# Patient Record
Sex: Male | Born: 1965 | Race: White | Hispanic: No | Marital: Single | Smoking: Current every day smoker
Health system: Southern US, Community
[De-identification: ages and names within clinical notes are randomized; demographics above are authoritative.]

## PROBLEM LIST (undated history)

## (undated) DIAGNOSIS — F1910 Other psychoactive substance abuse, uncomplicated: Secondary | ICD-10-CM

## (undated) DIAGNOSIS — Z720 Tobacco use: Secondary | ICD-10-CM

---

## 1999-10-19 ENCOUNTER — Emergency Department (HOSPITAL_COMMUNITY): Admission: EM | Admit: 1999-10-19 | Discharge: 1999-10-19 | Payer: Self-pay | Admitting: Emergency Medicine

## 2004-01-02 ENCOUNTER — Inpatient Hospital Stay (HOSPITAL_COMMUNITY): Admission: AD | Admit: 2004-01-02 | Discharge: 2004-01-11 | Payer: Self-pay | Admitting: Psychiatry

## 2020-10-23 ENCOUNTER — Inpatient Hospital Stay (HOSPITAL_COMMUNITY): Payer: BC Managed Care – PPO

## 2020-10-23 ENCOUNTER — Inpatient Hospital Stay (HOSPITAL_COMMUNITY)
Admission: EM | Admit: 2020-10-23 | Discharge: 2020-10-26 | DRG: 917 | Disposition: A | Discharge status: Home / Self Care | Payer: BC Managed Care – PPO | Attending: Student | Admitting: Student

## 2020-10-23 ENCOUNTER — Encounter (HOSPITAL_COMMUNITY): Payer: Self-pay

## 2020-10-23 ENCOUNTER — Emergency Department (HOSPITAL_COMMUNITY): Payer: BC Managed Care – PPO

## 2020-10-23 ENCOUNTER — Other Ambulatory Visit: Payer: Self-pay

## 2020-10-23 DIAGNOSIS — F1490 Cocaine use, unspecified, uncomplicated: Secondary | ICD-10-CM | POA: Diagnosis not present

## 2020-10-23 DIAGNOSIS — T68XXXA Hypothermia, initial encounter: Secondary | ICD-10-CM

## 2020-10-23 DIAGNOSIS — R7989 Other specified abnormal findings of blood chemistry: Secondary | ICD-10-CM | POA: Diagnosis present

## 2020-10-23 DIAGNOSIS — G928 Other toxic encephalopathy: Secondary | ICD-10-CM | POA: Diagnosis present

## 2020-10-23 DIAGNOSIS — G9341 Metabolic encephalopathy: Secondary | ICD-10-CM | POA: Diagnosis not present

## 2020-10-23 DIAGNOSIS — Z20822 Contact with and (suspected) exposure to covid-19: Secondary | ICD-10-CM | POA: Diagnosis present

## 2020-10-23 DIAGNOSIS — N179 Acute kidney failure, unspecified: Secondary | ICD-10-CM | POA: Diagnosis present

## 2020-10-23 DIAGNOSIS — I214 Non-ST elevation (NSTEMI) myocardial infarction: Secondary | ICD-10-CM | POA: Diagnosis present

## 2020-10-23 DIAGNOSIS — Z8249 Family history of ischemic heart disease and other diseases of the circulatory system: Secondary | ICD-10-CM | POA: Diagnosis not present

## 2020-10-23 DIAGNOSIS — F17200 Nicotine dependence, unspecified, uncomplicated: Secondary | ICD-10-CM | POA: Diagnosis present

## 2020-10-23 DIAGNOSIS — Y900 Blood alcohol level of less than 20 mg/100 ml: Secondary | ICD-10-CM | POA: Diagnosis present

## 2020-10-23 DIAGNOSIS — D6959 Other secondary thrombocytopenia: Secondary | ICD-10-CM | POA: Diagnosis present

## 2020-10-23 DIAGNOSIS — I9589 Other hypotension: Secondary | ICD-10-CM | POA: Diagnosis not present

## 2020-10-23 DIAGNOSIS — R570 Cardiogenic shock: Secondary | ICD-10-CM | POA: Diagnosis present

## 2020-10-23 DIAGNOSIS — R339 Retention of urine, unspecified: Secondary | ICD-10-CM | POA: Diagnosis present

## 2020-10-23 DIAGNOSIS — G929 Unspecified toxic encephalopathy: Secondary | ICD-10-CM | POA: Diagnosis not present

## 2020-10-23 DIAGNOSIS — F10129 Alcohol abuse with intoxication, unspecified: Secondary | ICD-10-CM | POA: Diagnosis not present

## 2020-10-23 DIAGNOSIS — T402X1A Poisoning by other opioids, accidental (unintentional), initial encounter: Secondary | ICD-10-CM | POA: Diagnosis present

## 2020-10-23 DIAGNOSIS — A419 Sepsis, unspecified organism: Secondary | ICD-10-CM | POA: Diagnosis present

## 2020-10-23 DIAGNOSIS — E872 Acidosis: Secondary | ICD-10-CM | POA: Diagnosis present

## 2020-10-23 DIAGNOSIS — F1910 Other psychoactive substance abuse, uncomplicated: Secondary | ICD-10-CM | POA: Diagnosis present

## 2020-10-23 DIAGNOSIS — T68XXXD Hypothermia, subsequent encounter: Secondary | ICD-10-CM | POA: Diagnosis not present

## 2020-10-23 DIAGNOSIS — F1010 Alcohol abuse, uncomplicated: Secondary | ICD-10-CM | POA: Diagnosis present

## 2020-10-23 DIAGNOSIS — T40601A Poisoning by unspecified narcotics, accidental (unintentional), initial encounter: Secondary | ICD-10-CM

## 2020-10-23 DIAGNOSIS — Y92009 Unspecified place in unspecified non-institutional (private) residence as the place of occurrence of the external cause: Secondary | ICD-10-CM

## 2020-10-23 DIAGNOSIS — K769 Liver disease, unspecified: Secondary | ICD-10-CM | POA: Diagnosis not present

## 2020-10-23 DIAGNOSIS — T405X1A Poisoning by cocaine, accidental (unintentional), initial encounter: Secondary | ICD-10-CM | POA: Diagnosis not present

## 2020-10-23 DIAGNOSIS — R6521 Severe sepsis with septic shock: Secondary | ICD-10-CM | POA: Diagnosis not present

## 2020-10-23 DIAGNOSIS — R579 Shock, unspecified: Secondary | ICD-10-CM | POA: Diagnosis not present

## 2020-10-23 DIAGNOSIS — D696 Thrombocytopenia, unspecified: Secondary | ICD-10-CM | POA: Diagnosis not present

## 2020-10-23 DIAGNOSIS — F1410 Cocaine abuse, uncomplicated: Secondary | ICD-10-CM | POA: Diagnosis not present

## 2020-10-23 DIAGNOSIS — D7589 Other specified diseases of blood and blood-forming organs: Secondary | ICD-10-CM | POA: Diagnosis present

## 2020-10-23 DIAGNOSIS — E876 Hypokalemia: Secondary | ICD-10-CM | POA: Diagnosis present

## 2020-10-23 DIAGNOSIS — I700 Atherosclerosis of aorta: Secondary | ICD-10-CM | POA: Diagnosis present

## 2020-10-23 DIAGNOSIS — R4182 Altered mental status, unspecified: Secondary | ICD-10-CM

## 2020-10-23 DIAGNOSIS — T40604A Poisoning by unspecified narcotics, undetermined, initial encounter: Secondary | ICD-10-CM

## 2020-10-23 DIAGNOSIS — R680 Hypothermia, not associated with low environmental temperature: Secondary | ICD-10-CM | POA: Diagnosis present

## 2020-10-23 DIAGNOSIS — R748 Abnormal levels of other serum enzymes: Secondary | ICD-10-CM | POA: Diagnosis not present

## 2020-10-23 DIAGNOSIS — R778 Other specified abnormalities of plasma proteins: Secondary | ICD-10-CM | POA: Diagnosis not present

## 2020-10-23 DIAGNOSIS — I959 Hypotension, unspecified: Secondary | ICD-10-CM

## 2020-10-23 HISTORY — DX: Tobacco use: Z72.0

## 2020-10-23 HISTORY — DX: Other psychoactive substance abuse, uncomplicated: F19.10

## 2020-10-23 LAB — COMPREHENSIVE METABOLIC PANEL
ALT: 212 U/L — ABNORMAL HIGH (ref 0–44)
ALT: 332 U/L — ABNORMAL HIGH (ref 0–44)
AST: 381 U/L — ABNORMAL HIGH (ref 15–41)
AST: 611 U/L — ABNORMAL HIGH (ref 15–41)
Albumin: 4.1 g/dL (ref 3.5–5.0)
Albumin: 3.6 g/dL (ref 3.5–5.0)
Alkaline Phosphatase: 110 U/L (ref 38–126)
Alkaline Phosphatase: 86 U/L (ref 38–126)
Anion gap: 22 — ABNORMAL HIGH (ref 5–15)
Anion gap: 12 (ref 5–15)
BUN: 16 mg/dL (ref 6–20)
BUN: 28 mg/dL — ABNORMAL HIGH (ref 6–20)
CO2: 17 mmol/L — ABNORMAL LOW (ref 22–32)
CO2: 22 mmol/L (ref 22–32)
Calcium: 8.9 mg/dL (ref 8.9–10.3)
Calcium: 7.1 mg/dL — ABNORMAL LOW (ref 8.9–10.3)
Chloride: 105 mmol/L (ref 98–111)
Chloride: 108 mmol/L (ref 98–111)
Creatinine, Ser: 2.13 mg/dL — ABNORMAL HIGH (ref 0.61–1.24)
Creatinine, Ser: 1.99 mg/dL — ABNORMAL HIGH (ref 0.61–1.24)
GFR, Estimated: 36 mL/min — ABNORMAL LOW (ref >60)
GFR, Estimated: 39 mL/min — ABNORMAL LOW (ref >60)
Glucose, Bld: 127 mg/dL — ABNORMAL HIGH (ref 70–99)
Glucose, Bld: 122 mg/dL — ABNORMAL HIGH (ref 70–99)
Potassium: 4.7 mmol/L (ref 3.5–5.1)
Potassium: 5.3 mmol/L — ABNORMAL HIGH (ref 3.5–5.1)
Sodium: 144 mmol/L (ref 135–145)
Sodium: 142 mmol/L (ref 135–145)
Total Bilirubin: 0.8 mg/dL (ref 0.3–1.2)
Total Bilirubin: 0.9 mg/dL (ref 0.3–1.2)
Total Protein: 7.4 g/dL (ref 6.5–8.1)
Total Protein: 6.1 g/dL — ABNORMAL LOW (ref 6.5–8.1)

## 2020-10-23 LAB — ACETAMINOPHEN LEVEL
Acetaminophen (Tylenol), Serum: <10 ug/mL — ABNORMAL LOW (ref 10–30)
Acetaminophen (Tylenol), Serum: <10 ug/mL — ABNORMAL LOW (ref 10–30)

## 2020-10-23 LAB — CBC WITH DIFFERENTIAL/PLATELET
Abs Immature Granulocytes: 0.53 10*3/uL — ABNORMAL HIGH (ref 0.00–0.07)
Basophils Absolute: 0.1 10*3/uL (ref 0.0–0.1)
Basophils Relative: 1 %
Eosinophils Absolute: 0 10*3/uL (ref 0.0–0.5)
Eosinophils Relative: 0 %
HCT: 50.2 % (ref 39.0–52.0)
Hemoglobin: 15.3 g/dL (ref 13.0–17.0)
Immature Granulocytes: 3 %
Lymphocytes Relative: 17 %
Lymphs Abs: 3.4 10*3/uL (ref 0.7–4.0)
MCH: 31 pg (ref 26.0–34.0)
MCHC: 30.5 g/dL (ref 30.0–36.0)
MCV: 101.6 fL — ABNORMAL HIGH (ref 80.0–100.0)
Monocytes Absolute: 0.8 10*3/uL (ref 0.1–1.0)
Monocytes Relative: 4 %
Neutro Abs: 15 10*3/uL — ABNORMAL HIGH (ref 1.7–7.7)
Neutrophils Relative %: 75 %
Platelets: 200 10*3/uL (ref 150–400)
RBC: 4.94 MIL/uL (ref 4.22–5.81)
RDW: 13.6 % (ref 11.5–15.5)
WBC: 19.9 10*3/uL — ABNORMAL HIGH (ref 4.0–10.5)
nRBC: 0 % (ref 0.0–0.2)

## 2020-10-23 LAB — CK
Total CK: 599 U/L — ABNORMAL HIGH (ref 49–397)
Total CK: 635 U/L — ABNORMAL HIGH (ref 49–397)

## 2020-10-23 LAB — IRON AND TIBC
Iron: 118 ug/dL (ref 45–182)
Saturation Ratios: 35 % (ref 17.9–39.5)
TIBC: 336 ug/dL (ref 250–450)
UIBC: 218 ug/dL

## 2020-10-23 LAB — FOLATE: Folate: 21 ng/mL (ref >5.9)

## 2020-10-23 LAB — PROCALCITONIN: Procalcitonin: 2.5 ng/mL

## 2020-10-23 LAB — RETICULOCYTES
Immature Retic Fract: 6.1 % (ref 2.3–15.9)
RBC.: 4.36 MIL/uL (ref 4.22–5.81)
Retic Count, Absolute: 55.8 10*3/uL (ref 19.0–186.0)
Retic Ct Pct: 1.3 % (ref 0.4–3.1)

## 2020-10-23 LAB — TSH: TSH: 0.99 u[IU]/mL (ref 0.350–4.500)

## 2020-10-23 LAB — MAGNESIUM: Magnesium: 3.3 mg/dL — ABNORMAL HIGH (ref 1.7–2.4)

## 2020-10-23 LAB — RAPID URINE DRUG SCREEN, HOSP PERFORMED
Amphetamines: NOT DETECTED
Barbiturates: NOT DETECTED
Benzodiazepines: NOT DETECTED
Cocaine: POSITIVE — AB
Opiates: NOT DETECTED
Tetrahydrocannabinol: NOT DETECTED

## 2020-10-23 LAB — TROPONIN I (HIGH SENSITIVITY)
Troponin I (High Sensitivity): 1389 ng/L (ref <18)
Troponin I (High Sensitivity): 2480 ng/L (ref <18)
Troponin I (High Sensitivity): 2690 ng/L (ref <18)

## 2020-10-23 LAB — ETHANOL: Alcohol, Ethyl (B): <10 mg/dL (ref <10)

## 2020-10-23 LAB — LACTIC ACID, PLASMA
Lactic Acid, Venous: 6.4 mmol/L (ref 0.5–1.9)
Lactic Acid, Venous: 1.8 mmol/L (ref 0.5–1.9)

## 2020-10-23 LAB — RESP PANEL BY RT-PCR (FLU A&B, COVID) ARPGX2
Influenza A by PCR: NEGATIVE
Influenza B by PCR: NEGATIVE
SARS Coronavirus 2 by RT PCR: NEGATIVE

## 2020-10-23 LAB — PROTIME-INR
INR: 1.5 — ABNORMAL HIGH (ref 0.8–1.2)
Prothrombin Time: 17.8 seconds — ABNORMAL HIGH (ref 11.4–15.2)

## 2020-10-23 LAB — SALICYLATE LEVEL
Salicylate Lvl: <7 mg/dL — ABNORMAL LOW (ref 7.0–30.0)
Salicylate Lvl: <7 mg/dL — ABNORMAL LOW (ref 7.0–30.0)

## 2020-10-23 LAB — OSMOLALITY: Osmolality: 309 mOsm/kg — ABNORMAL HIGH (ref 275–295)

## 2020-10-23 LAB — VITAMIN B12: Vitamin B-12: 700 pg/mL (ref 180–914)

## 2020-10-23 LAB — HIV ANTIBODY (ROUTINE TESTING W REFLEX): HIV Screen 4th Generation wRfx: NONREACTIVE

## 2020-10-23 LAB — CORTISOL: Cortisol, Plasma: 41.7 ug/dL

## 2020-10-23 LAB — FERRITIN: Ferritin: 3337 ng/mL — ABNORMAL HIGH (ref 24–336)

## 2020-10-23 LAB — GLUCOSE, CAPILLARY: Glucose-Capillary: 155 mg/dL — ABNORMAL HIGH (ref 70–99)

## 2020-10-23 MED ORDER — SODIUM CHLORIDE 0.9 % IV SOLN
250.0000 mL | INTRAVENOUS | Status: DC
  Administered 2020-10-23: 250 mL via INTRAVENOUS

## 2020-10-23 MED ORDER — VANCOMYCIN VARIABLE DOSE PER UNSTABLE RENAL FUNCTION (PHARMACIST DOSING): Status: DC

## 2020-10-23 MED ORDER — NOREPINEPHRINE 4 MG/250ML-% IV SOLN
2.0000 ug/min | INTRAVENOUS | Status: DC
  Administered 2020-10-23: 5 ug/min via INTRAVENOUS

## 2020-10-23 MED ORDER — LORAZEPAM 1 MG PO TABS: 0.0000 mg | ORAL_TABLET | Freq: Four times a day (QID) | ORAL | Status: DC

## 2020-10-23 MED ORDER — SODIUM CHLORIDE 0.9 % IV SOLN
2.0000 g | Freq: Two times a day (BID) | INTRAVENOUS | Status: DC
  Administered 2020-10-24 (×2): 2 g via INTRAVENOUS
  Filled 2020-10-23 (×2): qty 2

## 2020-10-23 MED ORDER — PIPERACILLIN-TAZOBACTAM 3.375 G IVPB 30 MIN
3.3750 g | Freq: Once | INTRAVENOUS | Status: AC
  Administered 2020-10-23: 3.375 g via INTRAVENOUS
  Filled 2020-10-23: qty 50

## 2020-10-23 MED ORDER — LORAZEPAM 2 MG/ML IJ SOLN: 0.0000 mg | Freq: Four times a day (QID) | INTRAMUSCULAR | Status: DC

## 2020-10-23 MED ORDER — THIAMINE HCL 100 MG/ML IJ SOLN
100.0000 mg | Freq: Every day | INTRAMUSCULAR | Status: DC
  Administered 2020-10-24 – 2020-10-26 (×3): 100 mg via INTRAVENOUS
  Filled 2020-10-23 (×3): qty 2

## 2020-10-23 MED ORDER — LACTATED RINGERS IV SOLN: INTRAVENOUS | Status: AC

## 2020-10-23 MED ORDER — ENOXAPARIN SODIUM 40 MG/0.4ML IJ SOSY: 40.0000 mg | PREFILLED_SYRINGE | INTRAMUSCULAR | Status: DC

## 2020-10-23 MED ORDER — POLYETHYLENE GLYCOL 3350 17 G PO PACK: 17.0000 g | PACK | Freq: Every day | ORAL | Status: DC | PRN

## 2020-10-23 MED ORDER — SODIUM CHLORIDE 0.9 % IV BOLUS
1000.0000 mL | Freq: Once | INTRAVENOUS | Status: AC
  Administered 2020-10-23: 1000 mL via INTRAVENOUS

## 2020-10-23 MED ORDER — THIAMINE HCL 100 MG/ML IJ SOLN
100.0000 mg | Freq: Once | INTRAMUSCULAR | Status: AC
  Administered 2020-10-23: 100 mg via INTRAVENOUS
  Filled 2020-10-23: qty 2

## 2020-10-23 MED ORDER — HEPARIN SODIUM (PORCINE) 5000 UNIT/ML IJ SOLN
5000.0000 [IU] | Freq: Three times a day (TID) | INTRAMUSCULAR | Status: DC
  Administered 2020-10-23 – 2020-10-24 (×2): 5000 [IU] via SUBCUTANEOUS
  Filled 2020-10-23 (×2): qty 1

## 2020-10-23 MED ORDER — VANCOMYCIN HCL 1250 MG/250ML IV SOLN
1250.0000 mg | Freq: Once | INTRAVENOUS | Status: AC
  Administered 2020-10-23: 1250 mg via INTRAVENOUS
  Filled 2020-10-23: qty 250

## 2020-10-23 MED ORDER — SODIUM CHLORIDE 0.9 % IV SOLN: 2.0000 g | Freq: Once | INTRAVENOUS | Status: DC

## 2020-10-23 MED ORDER — DOCUSATE SODIUM 100 MG PO CAPS
100.0000 mg | ORAL_CAPSULE | Freq: Two times a day (BID) | ORAL | Status: DC | PRN
  Administered 2020-10-26: 100 mg via ORAL
  Filled 2020-10-23: qty 1

## 2020-10-23 MED ORDER — NOREPINEPHRINE 4 MG/250ML-% IV SOLN
0.0000 ug/min | INTRAVENOUS | Status: DC
  Administered 2020-10-23: 2 ug/min via INTRAVENOUS
  Filled 2020-10-23: qty 250

## 2020-10-23 MED ORDER — LORAZEPAM 2 MG/ML IJ SOLN: 0.0000 mg | Freq: Two times a day (BID) | INTRAMUSCULAR | Status: DC

## 2020-10-23 MED ORDER — LACTATED RINGERS IV SOLN: INTRAVENOUS | Status: DC

## 2020-10-23 MED ORDER — NALOXONE HCL 0.4 MG/ML IJ SOLN
INTRAMUSCULAR | Status: AC
  Filled 2020-10-23: qty 1

## 2020-10-23 MED ORDER — METRONIDAZOLE 500 MG/100ML IV SOLN
500.0000 mg | Freq: Three times a day (TID) | INTRAVENOUS | Status: DC
  Administered 2020-10-24 (×2): 500 mg via INTRAVENOUS
  Filled 2020-10-23 (×2): qty 100

## 2020-10-23 MED ORDER — LACTATED RINGERS IV BOLUS
500.0000 mL | Freq: Once | INTRAVENOUS | Status: AC
  Administered 2020-10-23: 500 mL via INTRAVENOUS

## 2020-10-23 MED ORDER — SODIUM CHLORIDE 0.9 % IV SOLN: 2.0000 g | Freq: Two times a day (BID) | INTRAVENOUS | Status: DC

## 2020-10-23 MED ORDER — THIAMINE HCL 100 MG PO TABS: 100.0000 mg | ORAL_TABLET | Freq: Every day | ORAL | Status: DC

## 2020-10-23 MED ORDER — LORAZEPAM 1 MG PO TABS: 0.0000 mg | ORAL_TABLET | Freq: Two times a day (BID) | ORAL | Status: DC

## 2020-10-23 MED ORDER — LACTATED RINGERS IV BOLUS (SEPSIS)
500.0000 mL | Freq: Once | INTRAVENOUS | Status: AC
  Administered 2020-10-23: 500 mL via INTRAVENOUS

## 2020-10-23 NOTE — ED Triage Notes (Signed)
Pt BIB EMS . PT was found on someone back porch down.EMS states on arrival he had agonal w/4bpm & GCS 3. Pt was bagged and given narcan . Pt given arcan again on arrival. Pt has scrap to right hip and lip busted, On arrival, GCS 9. Pt is in collar. Pt placed ln bear hugger.

## 2020-10-23 NOTE — ED Notes (Signed)
Vaughan Browner (sister) called for an update at [phone]

## 2020-10-23 NOTE — Progress Notes (Addendum)
Pharmacy Antibiotic Note  Timothy Herring is a 55 y.o. male admitted on 10/23/2020 with sepsis.  Pharmacy has been consulted for vancomycin dosing. WBC 19.9. SCr elevated at 2.13. Of note, Zosyn x 1 dose ordered in the ED.   Plan: -Vancomycin 1250 mg IV load followed by vancomycin dosing per levels -Monitor CBC, renal fx, cultures and clinical progress -F/u maintenance Zosyn doses      Temp (24hrs), Avg:91.5 F (33.1 C), Min:91.5 F (33.1 C), Max:91.5 F (33.1 C)  Recent Labs  Lab 10/23/20 1336  WBC 19.9*  CREATININE 2.13*    CrCl cannot be calculated (Unknown ideal weight.).    Not on File  Antimicrobials this admission: Zosyn 5/28 >>  Vancomycin 528 >>   Dose adjustments this admission:   Microbiology results: 5/28 BCx:    Thank you for allowing pharmacy to be a part of this patient's care.  Vinnie Level, PharmD., BCPS, BCCCP Clinical Pharmacist Please refer to [facility] for unit-specific pharmacist   Addendum: Now adding maintenance cefepime and metronidazole for suspected intra-abdominal source.   -Start Cefepime 2 gm IV Q 12 hours  -Metronidazole 500 mg IV Q 8 hours per MD   Vinnie Level, PharmD., BCPS, BCCCP Clinical Pharmacist Please refer to [facility] for unit-specific pharmacist

## 2020-10-23 NOTE — Progress Notes (Signed)
ICU staff aware for need of 2nd LA for sepsis protocol

## 2020-10-23 NOTE — ED Provider Notes (Signed)
Medical Decision Making: Care of patient assumed from Dr. Wilkie Aye at [phone].  Agree with history, physical exam and plan.  See their note for further details.  Briefly, The pt p/w Drug overdose, responded well to narcan, hypothermic no with bair hugger, imaging was unremarkable thus far and broad spectrum abx were given.   Current plan is as follows: reassess and admit..  More IvF and likely need for pressors.  Patient not responding to fluid boluses well.  He is responsive following commands denies any pain or symptoms.  Despite fluid bolus administration the patient is still having hypotension.  Norepinephrine is started.  Critical care is consulted.   I personally reviewed and interpreted all labs/imaging.      Sabino Donovan, MD 10/23/20 [phone]

## 2020-10-23 NOTE — Progress Notes (Signed)
eLink Physician-Brief Progress Note Patient Name: Timothy Herring DOB: [birth year] MRN: [id number]   Date of Service  10/23/2020  HPI/Events of Note  Patient admitted with altered mental status, shock, lactic acidosis, and a history of a white powder found in his mouth suspected to be illicit drugs, EMS documented an improvement in mental status with Narcan but his toxicology screen was positive only for cocaine. Troponin is also elevated. Clinical picture suspicious for acute cocaine intoxication  + / - other unmeasured substances + / - septic shock of as yet undetermined etiology, work up is in progress.  eICU Interventions  New Patient Evaluation. Echocardiogram ordered, avoid beta blockers, monitor closely for respiratory failure requiring intubation.        Migdalia Dk 10/23/2020, 8:49 PM

## 2020-10-23 NOTE — ED Provider Notes (Addendum)
[facility] EMERGENCY DEPARTMENT Provider Note   CSN: [id number] Arrival date & time: 10/23/20  1317     History Chief Complaint  Patient presents with  . Drug Overdose    Timothy Herring is a 55 y.o. male.  HPI   55 year old male with no known past medical history presents to the emergency department after being found down on someone's back porch.  The owners of the house do not know the patient.  He was reportedly down and unresponsive.  Agonal respirations, got 1.5 mg of Narcan intranasally with minimal response, and additional 0.5 IV by EMS with a mild response.  Patient was reportedly found with a small baggy of a white substance in his mouth.  On arrival patient arouses to name but is otherwise very somnolent.  History reviewed. No pertinent past medical history.  There are no problems to display for this patient.   History reviewed. No pertinent surgical history.     History reviewed. No pertinent family history.  Social History   Tobacco Use  . Smoking status: Unknown If Ever Smoked    Home Medications Prior to Admission medications   Not on File    Allergies    Patient has no allergy information on record.  Review of Systems   Review of Systems  Unable to perform ROS: Patient unresponsive    Physical Exam Updated Vital Signs BP 100/85 (BP Location: Right Arm)   Pulse 83   Temp (!) 91.5 F (33.1 C) (Rectal)   Resp (!) 23   SpO2 96%   Physical Exam Vitals and nursing note reviewed.  Constitutional:      Comments: Somnolent  HENT:     Head: Normocephalic.     Nose:     Comments: Nasal airway    Mouth/Throat:     Mouth: Mucous membranes are dry.  Eyes:     Comments: Pupils are constricted, approximately 2 mm bilaterally, minimally responsive  Neck:     Comments: C-collar in place Cardiovascular:     Rate and Rhythm: Normal rate.  Pulmonary:     Comments: Tachypneic, protecting his airway Abdominal:     Palpations:  Abdomen is soft.  Musculoskeletal:     Comments: Scattered abrasions but no obvious deformity  Skin:    Comments: Cool to the touch  Neurological:     Comments: Arouses to name but otherwise very somnolent     ED Results / Procedures / Treatments   Labs (all labs ordered are listed, but only abnormal results are displayed) Labs Reviewed  CBC WITH DIFFERENTIAL/PLATELET  ETHANOL  COMPREHENSIVE METABOLIC PANEL  RAPID URINE DRUG SCREEN, HOSP PERFORMED  SALICYLATE LEVEL  ACETAMINOPHEN LEVEL    EKG None  Radiology No results found.  Procedures .Critical Care Performed by: Rozelle Logan, DO Authorized by: Rozelle Logan, DO   Critical care provider statement:    Critical care time (minutes):  45   Critical care was necessary to treat or prevent imminent or life-threatening deterioration of the following conditions:  Sepsis   Critical care was time spent personally by me on the following activities:  Discussions with consultants, evaluation of patient's response to treatment, examination of patient, ordering and performing treatments and interventions, ordering and review of laboratory studies, ordering and review of radiographic studies, pulse oximetry, re-evaluation of patient's condition, obtaining history from patient or surrogate and review of old charts     Medications Ordered in ED Medications  sodium chloride 0.9 % bolus  1,000 mL (has no administration in time range)  naloxone (NARCAN) 0.4 MG/ML injection (  Given 10/23/20 1325)    ED Course  I have reviewed the triage vital signs and the nursing notes.  Pertinent labs & imaging results that were available during my care of the patient were reviewed by me and considered in my medical decision making (see chart for details).    MDM Rules/Calculators/A&P                          55 year old male presents the emergency department and altered mental status.  Was reportedly found down on a random person's back  porch.  He was reportedly agonal breathing with a bag of white substance in his mouth, improved with Narcan administration.  On arrival he is somnolent but arousable, further Narcan given, became slightly more arousable.  Now answers to his name, nods yes that he does drink alcohol, is unclear if he took any drugs.  Denies any pain complaints.  Chest x-ray and pelvis showed no acute traumatic finding.  Patient is found to be hypothermic at 91, he has a white count at 19.9 and a transaminitis that could be consistent with chronic alcohol use.  His current ethanol level is less than 10, this could have potentially been a withdrawal seizure.  He is currently being worked up for sepsis.  EKG does show a prolonged QTC, magnesium is pending.  Thiamine has been ordered.  We will do a straight cath for urinalysis, COVID swab is pending.  We are also pending CT imaging of the head and neck.  Patient signed out to oncoming provider.  Final Clinical Impression(s) / ED Diagnoses Final diagnoses:  None    Rx / DC Orders ED Discharge Orders    None       Rozelle Logan, DO 10/23/20 1508    Damari Suastegui, Clabe Seal, DO 10/23/20 1524

## 2020-10-23 NOTE — H&P (Addendum)
NAME:  Timothy Herring, MRN:  [id number], DOB:  [birth year], LOS: 0 ADMISSION DATE:  10/23/2020, CONSULTATION DATE:  5/28 REFERRING MD:  Myrtis Ser, CHIEF COMPLAINT:  shock   History of Present Illness:  55 year old male only hx on record tobacco abuse. Found unresponsive on strangers porch. On EMS arrival had agonal respiratory efforts. Had bag of "white substance" in mouth. Had response to IN narcan. Transferred to ER narcan repeated and pt more responsive. He was hypothermic on arrival w/ temp 91. Imaging for head neg. Got progressively hypotensive while in ER. Not responding to crystalloid administration. Lactic acid 6.4.  PCCM asked to admit.   Pertinent  Medical History  Tobacco abuse 2021 records note smokes 1 ppd  Remotes > 3 years ago substance abuse "clean since" per sister  Significant Hospital Events: Including procedures, antibiotic start and stop dates in addition to other pertinent events   . 6/11 55 year old admitted w/ acute encephalopathy. Presumed opioid overdose as responded to narcan. But while in ER got progressively hypotensive not responding to fluids. Was hypothermic w/ temp 91. Lactate > 6. Vanc, cefepime and flagyl started.    Interim History / Subjective:  hypotensive  Denies pain   Objective   Blood pressure (Abnormal) 76/62, pulse 91, temperature (Abnormal) 91.5 F (33.1 C), temperature source Rectal, resp. rate 10, SpO2 90 %.       No intake or output data in the 24 hours ending 10/23/20 1737 There were no vitals filed for this visit.  Examination: General: 55 year old WM  HENT: lying in bed. Currently on noreepi gtt. Will respond to voice  Lungs: cl, decreased bases. No accessory use  Cardiovascular: RRR  Abdomen: soft not tender + bowel sounds Extremities: warm and dry  Neuro: awakes to voice. Follow commands. Sp slurred. Pupils pinpoint. Moves all ext oriented x2 GU: due to void   Labs/imaging that I havepersonally reviewed  (right click and  "Reselect all SmartList Selections" daily)  See below   Resolved Hospital Problem list     Assessment & Plan:  Acute metabolic encephalopathy (POA). Per report responded to Narcan administration, and mental status has slowly improved with time, he adamantly denies drug abuse, states the white powder bag in his mouth was BC powder.  Per his sister he has been clean for over 3 years.  Primary differential here would be unintentional substance overdose versus infection versus mix of both Plan Follow-up urinary drug screen Panculture Supportive IV fluids Telemetry Pulse oximetry Repeat salicylate and acetaminophen levels  Severe sepsis septic shock with lactic acidosis  and hypothermia (POA) .  Presents with hypothermia, and leukocytosis.  Initial chest x-ray clear. No hypoxia or significant work of breathing Plan Continue IV fluids Peripheral norepinephrine for mean arterial pressure greater than 65 Checking cortisol and TSH, given hypothermia Follow-up lactate F/u cultures CT abdomen/pelvis looking for potential alternate explanation of sepsis as chest x-ray clear, CT head clear, no nuchal rigidity appreciated. Broad-spectrum antibiotics day 1 vanc, cefepime and flagyl   Acute kidney injury with anion gap metabolic acidosis (POA) Plan Renal dose medications IV hydration Strict intake output Serial chemistries  Macrocytosis. Plan Check thiamine and folate  Mild LFT abnormality Plan F/u cmp  CT abd pelvis   Best practice (right click and "Reselect all SmartList Selections" daily)  Diet:  NPO Pain/Anxiety/Delirium protocol (if indicated): No VAP protocol (if indicated): Not indicated DVT prophylaxis: LMWH GI prophylaxis: N/A Glucose control:  SSI No Central venous access:  N/A  Arterial line:  N/A Foley:  N/A Mobility:  bed rest  PT consulted: N/A Last date of multidisciplinary goals of care discussion [pending] Code Status:  full code Disposition: to ICU   Labs    CBC: Recent Labs  Lab 10/23/20 1336  WBC 19.9*  NEUTROABS 15.0*  HGB 15.3  HCT 50.2  MCV 101.6*  PLT 200    Basic Metabolic Panel: Recent Labs  Lab 10/23/20 1336 10/23/20 1452  NA 144  --   K 4.7  --   CL 105  --   CO2 17*  --   GLUCOSE 127*  --   BUN 16  --   CREATININE 2.13*  --   CALCIUM 8.9  --   MG  --  3.3*   GFR: CrCl cannot be calculated (Unknown ideal weight.). Recent Labs  Lab 10/23/20 1336 10/23/20 1627  WBC 19.9*  --   LATICACIDVEN  --  6.4*    Liver Function Tests: Recent Labs  Lab 10/23/20 1336  AST 381*  ALT 212*  ALKPHOS 110  BILITOT 0.8  PROT 7.4  ALBUMIN 4.1   No results for input(s): LIPASE, AMYLASE in the last 168 hours. No results for input(s): AMMONIA in the last 168 hours.  ABG No results found for: PHART, PCO2ART, PO2ART, HCO3, TCO2, ACIDBASEDEF, O2SAT   Coagulation Profile: No results for input(s): INR, PROTIME in the last 168 hours.  Cardiac Enzymes: No results for input(s): CKTOTAL, CKMB, CKMBINDEX, TROPONINI in the last 168 hours.  HbA1C: No results found for: HGBA1C  CBG: No results for input(s): GLUCAP in the last 168 hours.  Review of Systems:   Not  Able   Past Medical History:  He, has history of tobacco abuse.  Remote history of substance abuse  Surgical History:  History reviewed. No pertinent surgical history.   Social History:    History of tobacco abuse.  Works as a Curator.  Family History:  His family history is not on file.   Allergies Not on File   Home Medications  Prior to Admission medications   None      Critical care time: 32 min     Simonne Martinet ACNP-BC [facility] Pulmonary/Critical Care Pager # [phone] OR # [phone] if no answer    Pulmonary critical care attending:  This is a 55 year old gentleman, history of tobacco abuse, and prior history of substance abuse.  He is a Curator.  He was found unresponsive on a strangers porch with a white substance in his mouth.   He was given Narcan and was slightly more responsive.  Patient was found to be hypothermic with a core temp of 91 placed on a Bair hugger.  Initial lactate of 6.4 patient given fluids in the ER.  Patient's sister at bedside.  Reports he has been "clean for the past 3 years.  BP (!) 86/66   Pulse 92   Temp (!) 91.5 F (33.1 C) (Rectal)   Resp 16   SpO2 96%   General: Chronically ill-appearing gentleman middle-aged lying in gurney, Lawyer in place HEENT, tracking appropriately Heart: Regular rate rhythm S1-S2 Lungs: Bilateral breath sounds, no crackles no wheeze Abdomen: Soft nontender nondistended Extremities: Cool  Labs: Reviewed Lactate 6.4 Troponin 1300 White blood cell count 19.9 Tylenol and salicylate levels negative Even though patient states that the white powder substance in his mouth was [employer].  Assessment: Acute metabolic encephalopathy Possible drug overdose Severe sepsis, septic shock, lactic acidosis and hypothermia Acute kidney  injury Macrocytosis, question alcohol use History of polysubstance abuse Elevated LFTs.  Plan: Remains on Bair hugger Continue fluid resuscitation Ensure lactate clearance with repeat lactate May need vasopressors. Repeat salicylate and Tylenol levels Panculture CT abdomen pelvis Chest x-ray clear no overt sign of sepsis, head CT clear with no nuchal rigidity Remains on broad-spectrum antibiotics. Follow cultures  Patient may need central access if vasopressors are necessary. I have discussed this with the nighttime  This patient is critically ill with multiple organ system failure; which, requires frequent high complexity decision making, assessment, support, evaluation, and titration of therapies. This was completed through the application of advanced monitoring technologies and extensive interpretation of multiple databases. During this encounter critical care time was devoted to patient care services described in  this note for 48 minutes.  Josephine Igo, DO [facility] 10/23/2020 7:24 PM

## 2020-10-23 NOTE — Progress Notes (Signed)
Elink RN coming on to shift, following for Sepsis Protocol

## 2020-10-23 NOTE — Plan of Care (Signed)
Plan of Care:   Pt rounded on in the ED, remains awake and protecting his airway.  BP improving on Levophed .  CT abd/pelvis without acute findings.  Transferred to ICU, repeat EKG, ABG and labs pending, continue to closely monitor.     Darcella Gasman Jeriyah Granlund, PA-C [facility] See Amion for pager If no response to pager , please call [phone] until 7pm After 7:00 pm call Elink  [phone]

## 2020-10-24 ENCOUNTER — Encounter (HOSPITAL_COMMUNITY): Payer: Self-pay | Admitting: Pulmonary Disease

## 2020-10-24 ENCOUNTER — Inpatient Hospital Stay (HOSPITAL_COMMUNITY): Payer: BC Managed Care – PPO

## 2020-10-24 DIAGNOSIS — R579 Shock, unspecified: Secondary | ICD-10-CM

## 2020-10-24 DIAGNOSIS — K769 Liver disease, unspecified: Secondary | ICD-10-CM

## 2020-10-24 DIAGNOSIS — N179 Acute kidney failure, unspecified: Secondary | ICD-10-CM

## 2020-10-24 DIAGNOSIS — R778 Other specified abnormalities of plasma proteins: Secondary | ICD-10-CM

## 2020-10-24 DIAGNOSIS — F1490 Cocaine use, unspecified, uncomplicated: Secondary | ICD-10-CM

## 2020-10-24 DIAGNOSIS — I9589 Other hypotension: Secondary | ICD-10-CM

## 2020-10-24 DIAGNOSIS — I214 Non-ST elevation (NSTEMI) myocardial infarction: Secondary | ICD-10-CM

## 2020-10-24 LAB — LIPID PANEL
Cholesterol: 122 mg/dL (ref 0–200)
HDL: 42 mg/dL (ref >40)
LDL Cholesterol: 76 mg/dL (ref 0–99)
Total CHOL/HDL Ratio: 2.9 RATIO
Triglycerides: 20 mg/dL (ref <150)
VLDL: 4 mg/dL (ref 0–40)

## 2020-10-24 LAB — BASIC METABOLIC PANEL
Anion gap: 11 (ref 5–15)
BUN: 33 mg/dL — ABNORMAL HIGH (ref 6–20)
CO2: 21 mmol/L — ABNORMAL LOW (ref 22–32)
Calcium: 7.3 mg/dL — ABNORMAL LOW (ref 8.9–10.3)
Chloride: 110 mmol/L (ref 98–111)
Creatinine, Ser: 2 mg/dL — ABNORMAL HIGH (ref 0.61–1.24)
GFR, Estimated: 38 mL/min — ABNORMAL LOW (ref >60)
Glucose, Bld: 136 mg/dL — ABNORMAL HIGH (ref 70–99)
Potassium: 4.8 mmol/L (ref 3.5–5.1)
Sodium: 142 mmol/L (ref 135–145)

## 2020-10-24 LAB — GLUCOSE, CAPILLARY
Glucose-Capillary: 121 mg/dL — ABNORMAL HIGH (ref 70–99)
Glucose-Capillary: 113 mg/dL — ABNORMAL HIGH (ref 70–99)
Glucose-Capillary: 84 mg/dL (ref 70–99)
Glucose-Capillary: 140 mg/dL — ABNORMAL HIGH (ref 70–99)

## 2020-10-24 LAB — ECHOCARDIOGRAM COMPLETE
AR max vel: 2.27 cm2
AV Area VTI: 2.18 cm2
AV Area mean vel: 2.09 cm2
AV Mean grad: 3 mmHg
AV Peak grad: 5.2 mmHg
Ao pk vel: 1.14 m/s
Area-P 1/2: 2.37 cm2
Height: 71 in
MV VTI: 2.26 cm2
S' Lateral: 2.6 cm
Weight: 2335.11 oz

## 2020-10-24 LAB — PHOSPHORUS: Phosphorus: 5 mg/dL — ABNORMAL HIGH (ref 2.5–4.6)

## 2020-10-24 LAB — HEPARIN LEVEL (UNFRACTIONATED): Heparin Unfractionated: 0.34 IU/mL (ref 0.30–0.70)

## 2020-10-24 LAB — CBC
HCT: 40.8 % (ref 39.0–52.0)
Hemoglobin: 13.2 g/dL (ref 13.0–17.0)
MCH: 31.3 pg (ref 26.0–34.0)
MCHC: 32.4 g/dL (ref 30.0–36.0)
MCV: 96.7 fL (ref 80.0–100.0)
Platelets: 136 10*3/uL — ABNORMAL LOW (ref 150–400)
RBC: 4.22 MIL/uL (ref 4.22–5.81)
RDW: 14.1 % (ref 11.5–15.5)
WBC: 23.1 10*3/uL — ABNORMAL HIGH (ref 4.0–10.5)
nRBC: 0 % (ref 0.0–0.2)

## 2020-10-24 LAB — HEPATIC FUNCTION PANEL
ALT: 281 U/L — ABNORMAL HIGH (ref 0–44)
AST: 427 U/L — ABNORMAL HIGH (ref 15–41)
Albumin: 3.2 g/dL — ABNORMAL LOW (ref 3.5–5.0)
Alkaline Phosphatase: 60 U/L (ref 38–126)
Bilirubin, Direct: 0.1 mg/dL (ref 0.0–0.2)
Indirect Bilirubin: 0.7 mg/dL (ref 0.3–0.9)
Total Bilirubin: 0.8 mg/dL (ref 0.3–1.2)
Total Protein: 5.6 g/dL — ABNORMAL LOW (ref 6.5–8.1)

## 2020-10-24 LAB — MRSA PCR SCREENING: MRSA by PCR: NEGATIVE

## 2020-10-24 LAB — PROTIME-INR
INR: 1.4 — ABNORMAL HIGH (ref 0.8–1.2)
Prothrombin Time: 17 seconds — ABNORMAL HIGH (ref 11.4–15.2)

## 2020-10-24 LAB — TROPONIN I (HIGH SENSITIVITY)
Troponin I (High Sensitivity): 3954 ng/L (ref <18)
Troponin I (High Sensitivity): 2963 ng/L (ref <18)
Troponin I (High Sensitivity): 2962 ng/L (ref <18)

## 2020-10-24 LAB — MAGNESIUM: Magnesium: 2 mg/dL (ref 1.7–2.4)

## 2020-10-24 MED ORDER — ASPIRIN 325 MG PO TABS
650.0000 mg | ORAL_TABLET | Freq: Once | ORAL | Status: AC
  Administered 2020-10-24: 650 mg via ORAL
  Filled 2020-10-24: qty 2

## 2020-10-24 MED ORDER — CHLORHEXIDINE GLUCONATE CLOTH 2 % EX PADS
6.0000 | MEDICATED_PAD | Freq: Every day | CUTANEOUS | Status: DC
  Administered 2020-10-23 – 2020-10-24 (×2): 6 via TOPICAL

## 2020-10-24 MED ORDER — ASPIRIN 81 MG PO CHEW
81.0000 mg | CHEWABLE_TABLET | Freq: Every day | ORAL | Status: DC
  Administered 2020-10-25 – 2020-10-26 (×2): 81 mg via ORAL
  Filled 2020-10-24 (×2): qty 1

## 2020-10-24 MED ORDER — HEPARIN BOLUS VIA INFUSION
2000.0000 [IU] | Freq: Once | INTRAVENOUS | Status: AC
  Administered 2020-10-24: 2000 [IU] via INTRAVENOUS
  Filled 2020-10-24: qty 2000

## 2020-10-24 MED ORDER — HEPARIN (PORCINE) 25000 UT/250ML-% IV SOLN
850.0000 [IU]/h | INTRAVENOUS | Status: DC
  Administered 2020-10-24: 800 [IU]/h via INTRAVENOUS
  Filled 2020-10-24 (×2): qty 250

## 2020-10-24 NOTE — Consult Note (Addendum)
Cardiology Consultation:   Patient ID: Timothy Herring; [id number]; 07-Dec-1965   Admit date: 10/23/2020 Date of Consult: 10/24/2020  Primary Care Provider: Pcp, No Primary Cardiologist: New to [facility]  Patient Profile:   Timothy Herring is a 55 y.o. male with a hx of tobacco and substance abuse who is being seen today for the evaluation of elevated HsT at the request of Dr. Everardo All.   History of Present Illness:   Timothy Herring is a 55yo M with a hx as stated above who presented to [facility] 10/23/20 after being found unresponsive on a strangers porch. He reports he last remembers getting off work and going to [employer]. After that he states he had "many" alcoholic drinks and does not recall any other events which led him to his hospitalization. His sister is at bedside who helps fill in the HPI. He denies recent chest pain however states that he has a very physical occupation and has noticed worsening fatigue over the last several months. He denies SOB, recent illness, LE edema, PND, palpitations, or syncope. He does not follow regularly with an doctor. He drinks several energy drinks daily.Sister reports a strong family hx of CAD in both mother and father side with early MI and death. He was recently evaluated last year at [facility] for what he recalls as chest pain however notes indicate he was there for dizziness. He was treated with IVF and K+ replacement, felt to likely be secondary to dehydration.   On EMS arrival, the patient had agonal breathing. A bag of "white substance" was found in the patients mouth. He received IN narcan en route with response. He was hypothermic on arrival with a core temp at 23F. Lactic acid was elevated at 6.4. He was treated with IVF and empiric antibiotics. He was hypotensive and was started on pressors however he has since stabilized and these have since been weaned off.  Creatinine was  elevated at 2.00. LFTs elevated at 427/281. HsT found to be markedly elevated at  2690>>3954>>>2963. PT/INR elevated at 17.8 and 1.5. UDS positive for cocaine. Non-ischemic EKG. Echocardiogram with LVEF at 55-60% with no RWMA, mild LVH, trivial TR and no other valvular disease. PCCM was asked to admit for overdose with severe septic shock with lactic acidosis. Cardiology has been asked to consult given marked elevated HsT.   History reviewed. No pertinent past medical history.  History reviewed. No pertinent surgical history.   Prior to Admission medications   Not on File    Inpatient Medications: Scheduled Meds:  [START ON 10/25/2020] aspirin  81 mg Oral Daily   Chlorhexidine Gluconate Cloth  6 each Topical Daily   thiamine  100 mg Intravenous Daily   vancomycin variable dose per unstable renal function (pharmacist dosing)   Does not apply See admin instructions   Continuous Infusions:  sodium chloride 10 mL/hr at 10/24/20 1200   ceFEPime (MAXIPIME) IV Stopped (10/24/20 0315)   heparin 800 Units/hr (10/24/20 1200)   metronidazole Stopped (10/24/20 1058)   norepinephrine (LEVOPHED) Adult infusion Stopped (10/24/20 0151)   PRN Meds: docusate sodium, polyethylene glycol  Allergies:   Not on File  Social History:   Social History   Socioeconomic History   Marital status: Single    Spouse name: Not on file   Number of children: Not on file   Years of education: Not on file   Highest education level: Not on file  Occupational History   Not on file  Tobacco Use  Smoking status: Unknown If Ever Smoked   Smokeless tobacco: Not on file  Substance and Sexual Activity   Alcohol use: Not on file   Drug use: Not on file   Sexual activity: Not on file  Other Topics Concern   Not on file  Social History Narrative   Not on file   Social Determinants of Health   Financial Resource Strain: Not on file  Food Insecurity: Not on file  Transportation Needs: Not on file  Physical Activity: Not on file  Stress: Not on file  Social Connections: Not on file   Intimate Partner Violence: Not on file    Family History:   History reviewed. No pertinent family history. Family Status:  No family status information on file.    ROS:  Please see the history of present illness.  All other ROS reviewed and negative.     Physical Exam/Data:   Vitals:   10/24/20 0900 10/24/20 1000 10/24/20 1100 10/24/20 1200  BP: 116/77 109/73 112/74 103/72  Pulse: 77 76 76 70  Resp: 11 16 20 18   Temp:      TempSrc:      SpO2: 97% 98% 95% 99%  Weight:      Height:        Intake/Output Summary (Last 24 hours) at 10/24/2020 1302 Last data filed at 10/24/2020 1200 Gross per 24 hour  Intake 2888.73 ml  Output 925 ml  Net 1963.73 ml   Filed Weights   10/23/20 2030 10/24/20 0500  Weight: 65.1 kg 66.2 kg   Body mass index is 20.36 kg/m.   General: Well developed, well nourished, NAD Neck: Negative for carotid bruits. No JVD Lungs:Clear to ausculation bilaterally. Breathing is unlabored. Cardiovascular: RRR with S1 S2. No murmurs Abdomen: Soft, non-tender, non-distended. No obvious abdominal masses. Extremities: No edema. Radial pulses 2+ bilaterally Neuro: Alert and oriented. No focal deficits. No facial asymmetry. MAE spontaneously. Psych: Responds to questions appropriately with normal affect.    EKG:  The EKG was personally reviewed and demonstrates:  10/23/20 NSR with HR 82bpm with TWI in anterior leads and incomplete RBBB  Telemetry:  Telemetry was personally reviewed and demonstrates: 10/24/20 NSR with HR in the 60-80's   Relevant CV Studies:  Echocardiogram 10/24/20:   1. Left ventricular ejection fraction, by estimation, is 55 to 60%. The  left ventricle has normal function. The left ventricle has no regional  wall motion abnormalities. There is mild left ventricular hypertrophy.  Left ventricular diastolic parameters  were normal.   2. Right ventricular systolic function is normal. The right ventricular  size is normal. Tricuspid  regurgitation signal is inadequate for assessing  PA pressure.   3. The mitral valve is normal in structure. Trivial mitral valve  regurgitation. No evidence of mitral stenosis.   4. The aortic valve is tricuspid. Aortic valve regurgitation is not  visualized. No aortic stenosis is present.   5. The inferior vena cava is normal in size with greater than 50%  respiratory variability, suggesting right atrial pressure of 3 mmHg.   Laboratory Data:  Chemistry Recent Labs  Lab 10/23/20 1336 10/23/20 2000 10/24/20 0004  NA 144 142 142  K 4.7 5.3* 4.8  CL 105 108 110  CO2 17* 22 21*  GLUCOSE 127* 122* 136*  BUN 16 28* 33*  CREATININE 2.13* 1.99* 2.00*  CALCIUM 8.9 7.1* 7.3*  GFRNONAA 36* 39* 38*  ANIONGAP 22* 12 11    Total Protein  Date Value Ref Range Status  10/24/2020 5.6 (L) 6.5 - 8.1 g/dL Final   Albumin  Date Value Ref Range Status  10/24/2020 3.2 (L) 3.5 - 5.0 g/dL Final   AST  Date Value Ref Range Status  10/24/2020 427 (H) 15 - 41 U/L Final   ALT  Date Value Ref Range Status  10/24/2020 281 (H) 0 - 44 U/L Final   Alkaline Phosphatase  Date Value Ref Range Status  10/24/2020 60 38 - 126 U/L Final   Total Bilirubin  Date Value Ref Range Status  10/24/2020 0.8 0.3 - 1.2 mg/dL Final   Hematology Recent Labs  Lab 10/23/20 1336 10/23/20 1838 10/24/20 0004  WBC 19.9*  --  23.1*  RBC 4.94 4.36 4.22  HGB 15.3  --  13.2  HCT 50.2  --  40.8  MCV 101.6*  --  96.7  MCH 31.0  --  31.3  MCHC 30.5  --  32.4  RDW 13.6  --  14.1  PLT 200  --  136*   Cardiac EnzymesNo results for input(s): TROPONINI in the last 168 hours. No results for input(s): TROPIPOC in the last 168 hours.  BNPNo results for input(s): BNP, PROBNP in the last 168 hours.  DDimer No results for input(s): DDIMER in the last 168 hours. TSH:  Lab Results  Component Value Date   TSH 0.990 10/23/2020   Lipids:No results found for: CHOL, HDL, LDLCALC, LDLDIRECT, TRIG, CHOLHDL HgbA1c:No results  found for: HGBA1C  Radiology/Studies:  CT ABDOMEN PELVIS WO CONTRAST  Result Date: 10/23/2020 CLINICAL DATA:  Possible overdose EXAM: CT ABDOMEN AND PELVIS WITHOUT CONTRAST TECHNIQUE: Multidetector CT imaging of the abdomen and pelvis was performed following the standard protocol without IV contrast. COMPARISON:  None. FINDINGS: Lower chest: Mild bibasilar atelectatic changes are noted. Pectus excavatum is noted. Hepatobiliary: No focal liver abnormality is seen. No gallstones, gallbladder wall thickening, or biliary dilatation. Pancreas: Unremarkable. No pancreatic ductal dilatation or surrounding inflammatory changes. Spleen: Normal in size without focal abnormality. Adrenals/Urinary Tract: Adrenal glands are within normal limits. Kidneys are well visualized bilaterally. No renal calculi or obstructive changes are noted. Ureters are within normal limits. Bladder is well distended. Stomach/Bowel: Colon shows no obstructive or inflammatory changes. Mild retained fecal material is noted. The appendix is within normal limits. Small bowel and stomach are unremarkable. Vascular/Lymphatic: Aortic atherosclerosis. No enlarged abdominal or pelvic lymph nodes. Reproductive: Prostate is unremarkable. Other: No abdominal wall hernia or abnormality. No abdominopelvic ascites. Musculoskeletal: Degenerative changes of lumbar spine are noted. IMPRESSION: Mild bibasilar atelectatic changes. No other focal abnormality is noted. Electronically Signed   By: Alcide Clever M.D.   On: 10/23/2020 20:19   CT Head Wo Contrast  Result Date: 10/23/2020 CLINICAL DATA:  Fall EXAM: CT HEAD WITHOUT CONTRAST TECHNIQUE: Contiguous axial images were obtained from the base of the skull through the vertex without intravenous contrast. COMPARISON:  None. FINDINGS: Brain: No evidence of acute infarction, hemorrhage, hydrocephalus, extra-axial collection or mass lesion/mass effect. Vascular: No hyperdense vessel or unexpected calcification.  Skull: Normal. Negative for fracture or focal lesion. Sinuses/Orbits: No acute finding. Other: None. IMPRESSION: No acute intracranial pathology. Electronically Signed   By: Lauralyn Primes M.D.   On: 10/23/2020 15:00   CT Cervical Spine Wo Contrast  Result Date: 10/23/2020 CLINICAL DATA:  Recent fall with neck pain, initial encounter EXAM: CT CERVICAL SPINE WITHOUT CONTRAST TECHNIQUE: Multidetector CT imaging of the cervical spine was performed without intravenous contrast. Multiplanar CT image reconstructions were also generated. COMPARISON:  None. FINDINGS:  Alignment: Within normal limits. Skull base and vertebrae: 7 cervical segments are well visualized. Vertebral body height is well maintained. No acute fracture or acute facet abnormality is noted. Multilevel facet hypertrophic changes are seen as well as multilevel osteophytic changes. Soft tissues and spinal canal: Surrounding soft tissue structures are within normal limits. Upper chest: Visualized lung apices demonstrate some interstitial thickening of uncertain significance. This may represent some mild edema. Other: None IMPRESSION: Degenerative changes of the cervical spine without acute bony abnormality. Mild interstitial thickening in the apices which may represent some mild pulmonary edema. Electronically Signed   By: Alcide CleverMark  Lukens M.D.   On: 10/23/2020 15:27   DG Pelvis Portable  Result Date: 10/23/2020 CLINICAL DATA:  Drug overdose.  Fall. EXAM: PORTABLE PELVIS 1-2 VIEWS COMPARISON:  None. FINDINGS: No fracture or bone lesion. Bilateral superolateral hip joint space narrowing, moderate on the right and mild on the left. Marginal osteophytes project from the bases of both femoral heads. The SI joints and symphysis pubis are normally spaced and aligned. Soft tissues are unremarkable. IMPRESSION: 1. No fracture or acute finding. 2. Right greater than left hip joint degenerative changes. Electronically Signed   By: Amie Portlandavid  Ormond M.D.   On: 10/23/2020  13:56   DG Chest Portable 1 View  Result Date: 10/23/2020 CLINICAL DATA:  Drug overdose.  Fall. EXAM: PORTABLE CHEST 1 VIEW COMPARISON:  06/23/2004. FINDINGS: Cardiac silhouette normal in size and configuration. Normal mediastinal and hilar contours. Clear lungs.  No pleural effusion or pneumothorax. Skeletal structures are grossly intact. IMPRESSION: No active disease. Electronically Signed   By: Amie Portlandavid  Ormond M.D.   On: 10/23/2020 13:55   ECHOCARDIOGRAM COMPLETE  Result Date: 10/24/2020    ECHOCARDIOGRAM REPORT   Patient Name:   Timothy Herring Date of Exam: 10/24/2020 Medical Rec #:  [id number]         Height:       71.0 in Accession #:    [id number]        Weight:       145.9 lb Date of Birth:  01/10/1966         BSA:          1.844 m Patient Age:    54 years          BP:           107/71 mmHg Patient Gender: M                 HR:           79 bpm. Exam Location:  Inpatient Procedure: 2D Echo, Cardiac Doppler and Color Doppler Indications:    Elevated troponin  History:        Patient has no prior history of Echocardiogram examinations.                 Risk Factors:Current Smoker. Substance abuse.  Sonographer:    Ross LudwigArthur Guy RDCS (AE) Referring Phys: [id number] Ples SpecterMARGO T LANNAN  Sonographer Comments: Image acquisition challenging due to respiratory motion. IMPRESSIONS  1. Left ventricular ejection fraction, by estimation, is 55 to 60%. The left ventricle has normal function. The left ventricle has no regional wall motion abnormalities. There is mild left ventricular hypertrophy. Left ventricular diastolic parameters were normal.  2. Right ventricular systolic function is normal. The right ventricular size is normal. Tricuspid regurgitation signal is inadequate for assessing PA pressure.  3. The mitral valve is normal in structure. Trivial mitral valve regurgitation. No evidence of mitral  stenosis.  4. The aortic valve is tricuspid. Aortic valve regurgitation is not visualized. No aortic stenosis is present.   5. The inferior vena cava is normal in size with greater than 50% respiratory variability, suggesting right atrial pressure of 3 mmHg. FINDINGS  Left Ventricle: Left ventricular ejection fraction, by estimation, is 55 to 60%. The left ventricle has normal function. The left ventricle has no regional wall motion abnormalities. The left ventricular internal cavity size was normal in size. There is  mild left ventricular hypertrophy. Left ventricular diastolic parameters were normal. Right Ventricle: The right ventricular size is normal. Right ventricular systolic function is normal. Tricuspid regurgitation signal is inadequate for assessing PA pressure. The tricuspid regurgitant velocity is 1.96 m/s, and with an assumed right atrial  pressure of 8 mmHg, the estimated right ventricular systolic pressure is 23.4 mmHg. Left Atrium: Left atrial size was normal in size. Right Atrium: Right atrial size was normal in size. Pericardium: There is no evidence of pericardial effusion. Mitral Valve: The mitral valve is normal in structure. Trivial mitral valve regurgitation. No evidence of mitral valve stenosis. MV peak gradient, 2.7 mmHg. The mean mitral valve gradient is 1.0 mmHg. Tricuspid Valve: The tricuspid valve is normal in structure. Tricuspid valve regurgitation is trivial. No evidence of tricuspid stenosis. Aortic Valve: The aortic valve is tricuspid. Aortic valve regurgitation is not visualized. No aortic stenosis is present. Aortic valve mean gradient measures 3.0 mmHg. Aortic valve peak gradient measures 5.2 mmHg. Aortic valve area, by VTI measures 2.18 cm. Pulmonic Valve: The pulmonic valve was normal in structure. Pulmonic valve regurgitation is not visualized. No evidence of pulmonic stenosis. Aorta: The aortic root is normal in size and structure. Venous: The inferior vena cava is normal in size with greater than 50% respiratory variability, suggesting right atrial pressure of 3 mmHg. IAS/Shunts: No atrial  level shunt detected by color flow Doppler.  LEFT VENTRICLE PLAX 2D LVIDd:         4.00 cm  Diastology LVIDs:         2.60 cm  LV e' medial:    10.40 cm/s LV PW:         1.20 cm  LV E/e' medial:  6.8 LV IVS:        1.40 cm  LV e' lateral:   12.60 cm/s LVOT diam:     1.90 cm  LV E/e' lateral: 5.6 LV SV:         50 LV SV Index:   27 LVOT Area:     2.84 cm  RIGHT VENTRICLE             IVC RV Basal diam:  2.90 cm     IVC diam: 2.00 cm RV S prime:     12.70 cm/s TAPSE (M-mode): 2.3 cm LEFT ATRIUM           Index       RIGHT ATRIUM           Index LA diam:      2.90 cm 1.57 cm/m  RA Area:     16.70 cm LA Vol (A2C): 31.3 ml 16.97 ml/m RA Volume:   41.10 ml  22.29 ml/m LA Vol (A4C): 38.1 ml 20.66 ml/m  AORTIC VALVE AV Area (Vmax):    2.27 cm AV Area (Vmean):   2.09 cm AV Area (VTI):     2.18 cm AV Vmax:           114.00 cm/s AV Vmean:  87.400 cm/s AV VTI:            0.229 m AV Peak Grad:      5.2 mmHg AV Mean Grad:      3.0 mmHg LVOT Vmax:         91.20 cm/s LVOT Vmean:        64.500 cm/s LVOT VTI:          0.176 m LVOT/AV VTI ratio: 0.77  AORTA Ao Root diam: 3.40 cm Ao Asc diam:  3.20 cm MITRAL VALVE               TRICUSPID VALVE MV Area (PHT): 2.37 cm    TR Peak grad:   15.4 mmHg MV Area VTI:   2.26 cm    TR Vmax:        196.00 cm/s MV Peak grad:  2.7 mmHg MV Mean grad:  1.0 mmHg    SHUNTS MV Vmax:       0.81 m/s    Systemic VTI:  0.18 m MV Vmean:      48.8 cm/s   Systemic Diam: 1.90 cm MV Decel Time: 320 msec MV E velocity: 70.60 cm/s MV A velocity: 41.60 cm/s MV E/A ratio:  1.70 Olga Millers MD Electronically signed by Olga Millers MD Signature Date/Time: 10/24/2020/11:34:04 AM    Final    Assessment and Plan:   1. Elevated HsT with acute septic shock: -Presented to [facility] 10/23/20 after being found unresponsive on a strangers porch. On EMS arrival, the patient had agonal breathing. A bag of "white substance" was found in the patients mouth. He received IN narcan en route with response. He was  hypothermic on arrival with a core temp at 21F. -Lactic acid was elevated at 6.4>>treated with IVF and empiric antibiotics.  -He was hypotensive and was started on pressors however he has since stabilized and these have since been weaned off.  -Creatinine was  elevated at 2.00.  -LFTs elevated at 427/281.  -HsT found to be markedly elevated at 2690>>3954>>>2963. EKG with anterior TWI however no other tracing for comparison -Reports no chest pain however more fatigue over the last several months.  -Srtong family hx of MI/death on both mother and father sides -Echocardiogram with LVEF at 55-60% with no RWMA, mild LVH, trivial TR and no other valvular disease. -Abdominal CT on admission with aortic atherosclerosis -Placed on IV Heparin -Given marked HsT elevation and family hx of CAD with early MI, will likely need to undergo further ischemic evaluation once more stable with stress testing. Would avoid CCTA/cath due to AKI. Risk stratify with HbA1c, lipid panel although not a candidate for statins at this time -Consider ASA  QD -No beta blocker with cocaine use   3. Tobacco and polysubstance abuse: -Cessation strongly encouraged -UDS + for cocaine on admission   4. AKI: -Likely in the setting of septic shock>>continue with IVH -Continue to aviod nephrotoxic medications -Daily BMET  -Creatinine, 2.00 with no baseline for comparison>>peal at 2.13  5. Elevated LFTs: -No acute findings on abdominal CT -Follow trand -Likely from septic shock however also has hx of ETOH use, unclear amount   For questions or updates, please contact [facility] Please consult [email or web address] for contact info under Cardiology/STEMI.   SignedGeorgie Chard NP-C [facility] Pager: [phone] 10/24/2020 1:02 PM

## 2020-10-24 NOTE — Progress Notes (Signed)
Patient able to void 350 cc of dark amber urine in the urinal.  Bladder scan showed zero post residual volume.  Will continue to monitor.  Lavella Lemons RN

## 2020-10-24 NOTE — Progress Notes (Signed)
ANTICOAGULATION CONSULT NOTE  Pharmacy Consult for Heparin Indication: chest pain/ACS  No Known Allergies  Patient Measurements: Height: 5\' 11"  (180.3 cm) Weight: 66.2 kg (145 lb 15.1 oz) IBW/kg (Calculated) : 75.3  Vital Signs: Temp: 98.2 F (36.8 C) (05/29 1640) Temp Source: Oral (05/29 1640) BP: 99/56 (05/29 1640) Pulse Rate: 75 (05/29 1640)  Labs: Recent Labs    10/23/20 1336 10/23/20 1747 10/23/20 1944 10/23/20 2000 10/23/20 2059 10/23/20 2118 10/24/20 0004 10/24/20 0951 10/24/20 1200 10/24/20 1643  HGB 15.3  --   --   --   --   --  13.2  --   --   --   HCT 50.2  --   --   --   --   --  40.8  --   --   --   PLT 200  --   --   --   --   --  136*  --   --   --   LABPROT  --   --   --   --  17.8*  --   --  17.0*  --   --   INR  --   --   --   --  1.5*  --   --  1.4*  --   --   HEPARINUNFRC  --   --   --   --   --   --   --   --   --  0.34  CREATININE 2.13*  --   --  1.99*  --   --  2.00*  --   --   --   CKTOTAL  --   --  599*  --   --  635*  --   --   --   --   TROPONINIHS  --    < >  --   --   --  2,69002-04-1974* 2,963* 2,962*  --    < > = values in this interval not displayed.    Estimated Creatinine Clearance: 38.6 mL/min (A) (by C-G formula based on SCr of 2 mg/dL (H)).  Assessment: 55 year old male only hx on record tobacco abuse. Found unresponsive on strangers porch. On EMS arrival had agonal respiratory efforts. Had bag of "white substance" in mouth.  Now with elevated troponins. Pharmacy consulted to start Heparin drip.  Serum creatinine of 2 mg/dl.  Minimal history available, but no known anticoagulants.    Initial heparin level came back therapeutic at 0.34 (on lower end of goal range), on 800 units/hr. Hgb 13.2, plt 136. Trop starting to trend down to 2962. No s/sx of bleeding or infusion issues.   Goal of Therapy:  Heparin level 0.3-0.7 units/ml Monitor platelets by anticoagulation protocol: Yes   Plan:  Increase heparin infusion to 850 units/hr to  keep in goal range Check anti-Xa level daily while on heparin Continue to monitor H&H and platelets  [id number], PharmD, BCCCP Clinical Pharmacist  Phone: [phone] 10/24/2020 6:43 PM  Please check AMION for all [facility] Pharmacy phone numbers After 10:00 PM, call Main Pharmacy [phone]

## 2020-10-24 NOTE — Progress Notes (Signed)
ANTICOAGULATION CONSULT NOTE - Initial Consult  Pharmacy Consult for Heparin Indication: chest pain/ACS  Not on File  Patient Measurements: Weight: 66.2 kg (145 lb 15.1 oz)  Vital Signs: Temp: 98.2 F (36.8 C) (05/29 0329) Temp Source: Oral (05/29 0329) BP: 107/71 (05/29 0700) Pulse Rate: 79 (05/29 0700)  Labs: Recent Labs    10/23/20 1336 10/23/20 1747 10/23/20 1932 10/23/20 1944 10/23/20 2000 10/23/20 2059 10/23/20 2118 10/24/20 0004  HGB 15.3  --   --   --   --   --   --  13.2  HCT 50.2  --   --   --   --   --   --  40.8  PLT 200  --   --   --   --   --   --  136*  LABPROT  --   --   --   --   --  17.8*  --   --   INR  --   --   --   --   --  1.5*  --   --   CREATININE 2.13*  --   --   --  1.99*  --   --  2.00*  CKTOTAL  --   --   --  599*  --   --  635*  --   TROPONINIHS  --    < > 2,480*  --   --   --  2,690* 3,954*   < > = values in this interval not displayed.    CrCl cannot be calculated (Unknown ideal weight.).  Assessment: 55 year old male only hx on record tobacco abuse. Found unresponsive on strangers porch. On EMS arrival had agonal respiratory efforts. Had bag of "white substance" in mouth.  Now with elevated troponins. Pharmacy consulted to start Heparin drip.  Serum creatinine of 2 mg/dl.  Minimal history available, but no known anticoagulants.  Got heparin 5000 units subq at 0520 - will give a half bolus  Goal of Therapy:  Heparin level 0.3-0.7 units/ml Monitor platelets by anticoagulation protocol: Yes   Plan:  Give 2000 units bolus x 1  Start heparin infusion at 800 units/hr Check anti-Xa level in 8 hours and daily while on heparin Continue to monitor H&H and platelets  Jeanella Cara, PharmD, [facility] Clinical Pharmacist Please see AMION for all Pharmacists' Contact Phone Numbers 10/24/2020, 7:45 AM

## 2020-10-24 NOTE — Progress Notes (Signed)
  Echocardiogram 2D Echocardiogram has been performed.  Timothy Herring 10/24/2020, 10:32 AM

## 2020-10-24 NOTE — Progress Notes (Signed)
eLink Physician-Brief Progress Note Patient Name: Timothy Herring DOB: 05/17/1966 MRN: [id number]   Date of Service  10/24/2020  HPI/Events of Note  Bladder scan shows 812 cc of  Urinary retention.  eICU Interventions  In / Out Foley catheterization protocol ordered.        Thomasene Lot Loriene Taunton 10/24/2020, 6:13 AM

## 2020-10-24 NOTE — Progress Notes (Signed)
NAME:  Timothy Herring, MRN:  [id number], DOB:  25-Dec-1965, LOS: 1 ADMISSION DATE:  10/23/2020, CONSULTATION DATE:  5/28 REFERRING MD:  Myrtis Ser, CHIEF COMPLAINT:  shock   History of Present Illness:  55 year old male only hx on record tobacco abuse. Found unresponsive on strangers porch. On EMS arrival had agonal respiratory efforts. Had bag of "white substance" in mouth. Had response to IN narcan. Transferred to ER narcan repeated and pt more responsive. He was hypothermic on arrival w/ temp 91. Imaging for head neg. Got progressively hypotensive while in ER. Not responding to crystalloid administration. Lactic acid 6.4.  PCCM asked to admit.   Pertinent  Medical History  Tobacco abuse 2021 records note smokes 1 ppd  Remotes > 3 years ago substance abuse "clean since" per sister  Significant Hospital Events: Including procedures, antibiotic start and stop dates in addition to other pertinent events   . 20/35 55 year old admitted w/ acute encephalopathy. Presumed opioid overdose as responded to narcan. But while in ER got progressively hypotensive not responding to fluids. Was hypothermic w/ temp 91. Lactate > 6. Vanc, cefepime and flagyl started.   . 5/29 Encephalopathy has resolved. Weaned off pressors  Interim History / Subjective:  Weaned off pressors. Awak and alert and oriented  x 4  Objective   Blood pressure 107/71, pulse 78, temperature 98 F (36.7 C), temperature source Oral, resp. rate (!) 9, height 5\' 11"  (1.803 m), weight 66.2 kg, SpO2 94 %.        Intake/Output Summary (Last 24 hours) at 10/24/2020 0921 Last data filed at 10/24/2020 0600 Gross per 24 hour  Intake 2680.7 ml  Output 925 ml  Net 1755.7 ml   Filed Weights   10/23/20 2030 10/24/20 0500  Weight: 65.1 kg 66.2 kg   Physical Exam: General: Well-appearing, no acute distress HENT: [place], AT, OP clear, MMM Eyes: EOMI, no scleral icterus Respiratory: Clear to auscultation bilaterally.  No crackles, wheezing or  rales Cardiovascular: RRR, -M/R/G, no JVD GI: BS+, soft, nontender Extremities:-Edema,-tenderness Neuro: AAO x4, CNII-XII grossly intact Skin: Intact, no rashes or bruising Psych: Normal mood, normal affect   Labs/imaging that I havepersonally reviewed  (right click and "Reselect all SmartList Selections" daily)  K 4.8 BUN/Cr 33/2.00 Phos 5.0  AST 427 improving ALT 281 improving  Resolved Hospital Problem list     Assessment & Plan:   Acute metabolic encephalopathy (POA) secondary to unintentional substance overdose versus infection versus mix of both - resolved  Undifferentiated shock with lactic acidosis and hypothermia (POA) - resolved ?Cardiogenic shock in setting of elevated troponin Presents with hypothermia, and leukocytosis.  Initial chest x-ray clear. CT A/P overall negative for infectious process No hypoxia or significant work of breathing. Normal cortisol and TSH Lactic acidosis cleared Plan Off pressors Discontinue antibiotics F/u cultures Monitor fever/WBC  NSTEMI Echo with normal EF and no WMA Plan ASA, Heparin gtt Cardiology consulted. Patient would benefit from cardiac evaluation as an outpatient once AKI resolves. Recommend statin as outpatient when liver function improves  Acute kidney injury with anion gap metabolic acidosis (POA) Plan Renal dose medications Monitor UOP/Cr Strict intake output Foley if retention persists  Mild LFT abnormality - improving Plan F/u cmp  CT abd pelvis   Polysubstance abuse Social work referral for 04-29-1994 (right click and "Scientist, clinical (histocompatibility and immunogenetics)" daily)  Diet:  Oral Pain/Anxiety/Delirium protocol (if indicated): No VAP protocol (if indicated): Not indicated DVT prophylaxis: LMWH GI prophylaxis: N/A Glucose  control:  SSI No Central venous access:  N/A Arterial line:  N/A Foley:  N/A Mobility:  bed rest  PT consulted: N/A Last date of multidisciplinary goals of care discussion  [pending] Code Status:  full code Disposition: Transfer to [facility] to pick up 5/30   Critical care time: 35 min   The patient is critically ill with multiple organ systems failure and requires high complexity decision making for assessment and support, frequent evaluation and titration of therapies, application of advanced monitoring technologies and extensive interpretation of multiple databases.  Independent Critical Care Time: 35 Minutes.   Mechele Collin, M.D. [facility] Pulmonary/Critical Care Medicine 10/24/2020 12:36 PM   Please see Amion for pager number to reach on-call Pulmonary and Critical Care Team.

## 2020-10-25 DIAGNOSIS — N179 Acute kidney failure, unspecified: Secondary | ICD-10-CM | POA: Diagnosis present

## 2020-10-25 DIAGNOSIS — T40601A Poisoning by unspecified narcotics, accidental (unintentional), initial encounter: Secondary | ICD-10-CM

## 2020-10-25 DIAGNOSIS — T40604A Poisoning by unspecified narcotics, undetermined, initial encounter: Secondary | ICD-10-CM

## 2020-10-25 DIAGNOSIS — G9341 Metabolic encephalopathy: Secondary | ICD-10-CM | POA: Diagnosis present

## 2020-10-25 DIAGNOSIS — D696 Thrombocytopenia, unspecified: Secondary | ICD-10-CM | POA: Diagnosis not present

## 2020-10-25 LAB — CBC
HCT: 33.8 % — ABNORMAL LOW (ref 39.0–52.0)
Hemoglobin: 11.3 g/dL — ABNORMAL LOW (ref 13.0–17.0)
MCH: 31.8 pg (ref 26.0–34.0)
MCHC: 33.4 g/dL (ref 30.0–36.0)
MCV: 95.2 fL (ref 80.0–100.0)
Platelets: 79 10*3/uL — ABNORMAL LOW (ref 150–400)
RBC: 3.55 MIL/uL — ABNORMAL LOW (ref 4.22–5.81)
RDW: 13.9 % (ref 11.5–15.5)
WBC: 9.7 10*3/uL (ref 4.0–10.5)
nRBC: 0 % (ref 0.0–0.2)

## 2020-10-25 LAB — HEPARIN LEVEL (UNFRACTIONATED): Heparin Unfractionated: 0.29 IU/mL — ABNORMAL LOW (ref 0.30–0.70)

## 2020-10-25 LAB — BASIC METABOLIC PANEL
Anion gap: 6 (ref 5–15)
BUN: 34 mg/dL — ABNORMAL HIGH (ref 6–20)
CO2: 26 mmol/L (ref 22–32)
Calcium: 7.7 mg/dL — ABNORMAL LOW (ref 8.9–10.3)
Chloride: 105 mmol/L (ref 98–111)
Creatinine, Ser: 1.52 mg/dL — ABNORMAL HIGH (ref 0.61–1.24)
GFR, Estimated: 53 mL/min — ABNORMAL LOW (ref >60)
Glucose, Bld: 90 mg/dL (ref 70–99)
Potassium: 3.5 mmol/L (ref 3.5–5.1)
Sodium: 137 mmol/L (ref 135–145)

## 2020-10-25 MED ORDER — CALCIUM GLUCONATE-NACL 1-0.675 GM/50ML-% IV SOLN
1.0000 g | Freq: Once | INTRAVENOUS | Status: AC
  Administered 2020-10-25: 1000 mg via INTRAVENOUS
  Filled 2020-10-25: qty 50

## 2020-10-25 NOTE — Progress Notes (Signed)
PROGRESS NOTE    Timothy Herring   ZOX:[id number]  DOB: Dec 06, 1965  PCP: Pcp, No    DOA: 10/23/2020 LOS: 2   Brief Narrative   55 year old male only hx on record tobacco abuse. Found unresponsive on strangers porch. On EMS arrival had agonal respiratory efforts. Had bag of "white substance" in mouth. Had response to IN narcan. Transferred to ER narcan repeated and pt more responsive. He was hypothermic on arrival w/ temp 91. Imaging for head neg. Got progressively hypotensive while in ER. Not responding to crystalloid administration. Lactic acid 6.4.   Admitted to PCCM service for acute encephalopathy presumed due to opioid overdose (responded to Narcan) with hypotension requiring vasopressors.  Started on empiric broad spectrum antibiotics.     [facility] as of [facility]30.  Troponin elevated above 2900, pt was started on heparin.  Cardiology recommends outpatient follow up for ischemic evaluation.  Due to worsening thrombocytopenia, heparin drip stopped 5/30.  Patient free of chest pain.       Assessment & Plan   Active Problems:   Septic shock (HCC)   Acute metabolic encephalopathy -POA, resolved.  Attributed to unintentional substance overdose versus infection or combination.  Hypotension, lactic acidosis, hypothermia - ?  Due to cardiogenic shock in the setting of elevated troponin.  Treated as septic shock empirically.  No source of infection was identified. Antibiotics were stopped.  Weaned off pressors. -- Continue to monitor clinically off antibiotics and follow cultures  Non-STEMI -reassuring echocardiogram with normal EF and no wall motion abnormalities.  Cardiology was consulted and plan for further evaluation as outpatient setting. -- Off heparin drip due to worsening thrombocytopenia -- Continue aspirin -- Statin when liver function improves  Thrombocytopenia -not present on admission.  Has been on heparin infusion.  Off heparin.  Check HIT antibody.   Repeat CBC in the morning.  Monitor for any signs of bleeding.  AKI, anion gap metabolic acidosis -POA, renal function improving.  Monitor BMP.  Acute urinary retention -appears resolved.  Required in and out caths.  Reportedly bladder scans today negative and patient reports voiding well.  Monitor.  Elevated LFTs -improving.  Follow CMP.  Polysubstance abuse -TOC consulted for rehab/resources  Patient BMI: Body mass index is 20.36 kg/m.   DVT prophylaxis: Place and maintain sequential compression device Start: 10/25/20 1216   Diet:  Diet Orders (From admission, onward)    Start     Ordered   10/24/20 1151  Diet regular Room service appropriate? Yes; Fluid consistency: Thin  Diet effective now       Question Answer Comment  Room service appropriate? Yes   Fluid consistency: Thin      10/24/20 1150            Code Status: Full Code    Subjective 10/25/20    Patient seen the bedside today.  He reports feeling better and denies any chest pain, shortness of breath.  Does have mild cough and states he feels quite weak and tired but otherwise feels well.   Disposition Plan & Communication   Status is: Inpatient  Remains inpatient appropriate because: Ongoing diagnostic evaluation not appropriate for outpatient setting.  Close monitoring warranted given severity of illness and monitoring off antibiotics.  Worsening thrombocytopenia.  Dispo: The patient is from: Home              Anticipated d/c is to: Home  Patient currently is not medically stable to d/c.   Difficult to place patient: No    Consults, Procedures, Significant Events   Consultants:   Cardiology  Procedures:     Antimicrobials:  Anti-infectives (From admission, onward)   Start     Dose/Rate Route Frequency Ordered Stop   10/24/20 0600  ceFEPIme (MAXIPIME) 2 g in sodium chloride 0.9 % 100 mL IVPB  Status:  Discontinued        2 g 200 mL/hr over 30 Minutes Intravenous Every 12 hours  10/23/20 1852 10/23/20 1856   10/24/20 0200  metroNIDAZOLE (FLAGYL) IVPB 500 mg  Status:  Discontinued        500 mg 100 mL/hr over 60 Minutes Intravenous Every 8 hours 10/23/20 1847 10/24/20 1434   10/24/20 0200  ceFEPIme (MAXIPIME) 2 g in sodium chloride 0.9 % 100 mL IVPB  Status:  Discontinued        2 g 200 mL/hr over 30 Minutes Intravenous Every 12 hours 10/23/20 1856 10/24/20 1434   10/23/20 1900  ceFEPIme (MAXIPIME) 2 g in sodium chloride 0.9 % 100 mL IVPB  Status:  Discontinued        2 g 200 mL/hr over 30 Minutes Intravenous  Once 10/23/20 1847 10/23/20 1855   10/23/20 1600  vancomycin (VANCOREADY) IVPB 1250 mg/250 mL        1,250 mg 166.7 mL/hr over 90 Minutes Intravenous  Once 10/23/20 1547 10/23/20 1935   10/23/20 1546  vancomycin variable dose per unstable renal function (pharmacist dosing)  Status:  Discontinued         Does not apply See admin instructions 10/23/20 1547 10/24/20 1433   10/23/20 1530  piperacillin-tazobactam (ZOSYN) IVPB 3.375 g        3.375 g 100 mL/hr over 30 Minutes Intravenous  Once 10/23/20 1524 10/23/20 1906        Micro    Objective   Vitals:   10/24/20 1640 10/24/20 1945 10/25/20 0552 10/25/20 0911  BP: (!) 99/56 109/70 113/75 107/70  Pulse: 75 73 70 62  Resp: Temp: 98.2 F (36.8 C) 98.1 F (36.7 C) 99.1 F (37.3 C) 98 F (36.7 C)  TempSrc: Oral Oral Oral Oral  SpO2: 95% 97% 94% 96%  Weight:      Height:        Intake/Output Summary (Last 24 hours) at 10/25/2020 1532 Last data filed at 10/25/2020 0900 Gross per 24 hour  Intake 1022 ml  Output 1600 ml  Net -578 ml   Filed Weights   10/23/20 2030 10/24/20 0500  Weight: 65.1 kg 66.2 kg    Physical Exam:  General exam: awake, alert, no acute distress HEENT: atraumatic, clear conjunctiva, anicteric sclera, moist mucus membranes, hearing grossly normal  Respiratory system: Diminished breath sounds, no wheezes, rales or rhonchi, normal respiratory  effort. Cardiovascular system: normal S1/S2, RRR, no pedal edema.   Gastrointestinal system: soft, NT, ND, no HSM felt, +bowel sounds. Central nervous system: A&O x3. no gross focal neurologic deficits, normal speech but somewhat slow/delayed Skin: dry, intact, normal temperature, normal color, No rashes, lesions or ulcers seen on visualized skin Psychiatry: normal mood, flat affect, judgment and insight appear normal  Labs   Data Reviewed: I have personally reviewed following labs and imaging studies  CBC: Recent Labs  Lab 10/23/20 1336 10/24/20 0004 10/25/20 0141  WBC 19.9* 23.1* 9.7  NEUTROABS 15.0*  --   --   HGB 15.3 13.2 11.3*  HCT  50.2 40.8 33.8*  MCV 101.6* 96.7 95.2  PLT 200 136* 79*   Basic Metabolic Panel: Recent Labs  Lab 10/23/20 1336 10/23/20 1452 10/23/20 2000 10/24/20 0004 10/25/20 0141  NA 144  --  142 142 137  K 4.7  --  5.3* 4.8 3.5  CL 105  --  108 110 105  CO2 17*  --  22 21* 26  GLUCOSE 127*  --  122* 136* 90  BUN 16  --  28* 33* 34*  CREATININE 2.13*  --  1.99* 2.00* 1.52*  CALCIUM 8.9  --  7.1* 7.3* 7.7*  MG  --  3.3*  --  2.0  --   PHOS  --   --   --  5.0*  --    GFR: Estimated Creatinine Clearance: 50.8 mL/min (A) (by C-G formula based on SCr of 1.52 mg/dL (H)). Liver Function Tests: Recent Labs  Lab 10/23/20 1336 10/23/20 2000 10/24/20 0702  AST 381* 611* 427*  ALT 212* 332* 281*  ALKPHOS 110 86 60  BILITOT 0.8 0.9 0.8  PROT 7.4 6.1* 5.6*  ALBUMIN 4.1 3.6 3.2*   No results for input(s): LIPASE, AMYLASE in the last 168 hours. No results for input(s): AMMONIA in the last 168 hours. Coagulation Profile: Recent Labs  Lab 10/23/20 2059 10/24/20 0951  INR 1.5* 1.4*   Cardiac Enzymes: Recent Labs  Lab 10/23/20 1944 10/23/20 2118  CKTOTAL 599* 635*   BNP (last 3 results) No results for input(s): PROBNP in the last 8760 hours. HbA1C: No results for input(s): HGBA1C in the last 72 hours. CBG: Recent Labs  Lab  10/23/20 2311 10/24/20 0329 10/24/20 0718 10/24/20 1203 10/24/20 1505  GLUCAP 155* 121* 113* 84 140*   Lipid Profile: Recent Labs    10/24/20 1436  CHOL 122  HDL 42  LDLCALC 76  TRIG 20  CHOLHDL 2.9   Thyroid Function Tests: Recent Labs    10/23/20 1838  TSH 0.990   Anemia Panel: Recent Labs    10/23/20 1836 10/23/20 1838 10/23/20 1839  VITAMINB12 700  --   --   FOLATE  --   --  21.0  FERRITIN 3,337*  --   --   TIBC 336  --   --   IRON 118  --   --   RETICCTPCT  --  1.3  --    Sepsis Labs: Recent Labs  Lab 10/23/20 1627 10/23/20 1836 10/23/20 2059  PROCALCITON  --  2.50  --   LATICACIDVEN 6.4*  --  1.8    Recent Results (from the past 240 hour(s))  Culture, blood (routine x 2)     Status: None (Preliminary result)   Collection Time: 10/23/20  2:33 PM   Specimen: BLOOD  Result Value Ref Range Status   Specimen Description BLOOD SITE NOT SPECIFIED  Final   Special Requests   Final    BOTTLES DRAWN AEROBIC AND ANAEROBIC Blood Culture adequate volume   Culture   Final    NO GROWTH 2 DAYS Performed at [facility], [address] [place], [place] [address]    Report Status PENDING  Incomplete  Culture, blood (routine x 2)     Status: None (Preliminary result)   Collection Time: 10/23/20  2:38 PM   Specimen: BLOOD  Result Value Ref Range Status   Specimen Description BLOOD SITE NOT SPECIFIED  Final   Special Requests   Final    BOTTLES DRAWN AEROBIC AND ANAEROBIC Blood  Culture adequate volume   Culture   Final    NO GROWTH 2 DAYS Performed at [facility], [address] [place], [place] [address]    Report Status PENDING  Incomplete  Resp Panel by RT-PCR (Flu A&B, Covid) Nasopharyngeal Swab     Status: None   Collection Time: 10/23/20  4:54 PM   Specimen: Nasopharyngeal Swab; Nasopharyngeal(NP) swabs in vial transport medium  Result Value Ref Range Status   SARS Coronavirus 2 by RT PCR NEGATIVE NEGATIVE Final    Comment:  (NOTE) SARS-CoV-2 target nucleic acids are NOT DETECTED.  The SARS-CoV-2 RNA is generally detectable in upper respiratory specimens during the acute phase of infection. The lowest concentration of SARS-CoV-2 viral copies this assay can detect is 138 copies/mL. A negative result does not preclude SARS-Cov-2 infection and should not be used as the sole basis for treatment or other patient management decisions. A negative result may occur with  improper specimen collection/handling, submission of specimen other than nasopharyngeal swab, presence of viral mutation(s) within the areas targeted by this assay, and inadequate number of viral copies(<138 copies/mL). A negative result must be combined with clinical observations, patient history, and epidemiological information. The expected result is Negative.  Fact Sheet for Patients:  BloggerCourse.comhttps://www.fda.gov/media/152166/download  Fact Sheet for Healthcare Providers:  SeriousBroker.ithttps://www.fda.gov/media/152162/download  This test is no t yet approved or cleared by the [place] FDA and  has been authorized for detection and/or diagnosis of SARS-CoV-2 by FDA under an Emergency Use Authorization (EUA). This EUA will remain  in effect (meaning this test can be used) for the duration of the COVID-19 declaration under Section 564(b)(1) of the Act, 21 U.S.C.section 360bbb-3(b)(1), unless the authorization is terminated  or revoked sooner.       Influenza A by PCR NEGATIVE NEGATIVE Final   Influenza B by PCR NEGATIVE NEGATIVE Final    Comment: (NOTE) The Xpert Xpress SARS-CoV-2/FLU/RSV plus assay is intended as an aid in the diagnosis of influenza from Nasopharyngeal swab specimens and should not be used as a sole basis for treatment. Nasal washings and aspirates are unacceptable for Xpert Xpress SARS-CoV-2/FLU/RSV testing.  Fact Sheet for Patients: BloggerCourse.comhttps://www.fda.gov/media/152166/download  Fact Sheet for Healthcare  Providers: SeriousBroker.ithttps://www.fda.gov/media/152162/download  This test is not yet approved or cleared by the [place] FDA and has been authorized for detection and/or diagnosis of SARS-CoV-2 by FDA under an Emergency Use Authorization (EUA). This EUA will remain in effect (meaning this test can be used) for the duration of the COVID-19 declaration under Section 564(b)(1) of the Act, 21 U.S.C. section 360bbb-3(b)(1), unless the authorization is terminated or revoked.  Performed at [facility], [address] [place], [place] [address]   MRSA PCR Screening     Status: None   Collection Time: 10/23/20 11:30 PM   Specimen: Nasopharyngeal  Result Value Ref Range Status   MRSA by PCR NEGATIVE NEGATIVE Final    Comment:        The GeneXpert MRSA Assay (FDA approved for NASAL specimens only), is one component of a comprehensive MRSA colonization surveillance program. It is not intended to diagnose MRSA infection nor to guide or monitor treatment for MRSA infections. Performed at [facility], [address] [place], [place] [address]       Imaging Studies   CT ABDOMEN PELVIS WO CONTRAST  Result Date: 10/23/2020 CLINICAL DATA:  Possible overdose EXAM: CT ABDOMEN AND PELVIS WITHOUT CONTRAST TECHNIQUE: Multidetector CT imaging of the abdomen and pelvis  was performed following the standard protocol without IV contrast. COMPARISON:  None. FINDINGS: Lower chest: Mild bibasilar atelectatic changes are noted. Pectus excavatum is noted. Hepatobiliary: No focal liver abnormality is seen. No gallstones, gallbladder wall thickening, or biliary dilatation. Pancreas: Unremarkable. No pancreatic ductal dilatation or surrounding inflammatory changes. Spleen: Normal in size without focal abnormality. Adrenals/Urinary Tract: Adrenal glands are within normal limits. Kidneys are well visualized bilaterally. No renal calculi or obstructive changes are noted. Ureters are within normal limits.  Bladder is well distended. Stomach/Bowel: Colon shows no obstructive or inflammatory changes. Mild retained fecal material is noted. The appendix is within normal limits. Small bowel and stomach are unremarkable. Vascular/Lymphatic: Aortic atherosclerosis. No enlarged abdominal or pelvic lymph nodes. Reproductive: Prostate is unremarkable. Other: No abdominal wall hernia or abnormality. No abdominopelvic ascites. Musculoskeletal: Degenerative changes of lumbar spine are noted. IMPRESSION: Mild bibasilar atelectatic changes. No other focal abnormality is noted. Electronically Signed   By: Alcide Clever M.D.   On: 10/23/2020 20:19   ECHOCARDIOGRAM COMPLETE  Result Date: 10/24/2020    ECHOCARDIOGRAM REPORT   Patient Name:   WASYL DORNFELD Date of Exam: 10/24/2020 Medical Rec #:  [id number]         Height:       71.0 in Accession #:    [id number]        Weight:       145.9 lb Date of Birth:  10/21/1965         BSA:          1.844 m Patient Age:    54 years          BP:           107/71 mmHg Patient Gender: M                 HR:           79 bpm. Exam Location:  Inpatient Procedure: 2D Echo, Cardiac Doppler and Color Doppler Indications:    Elevated troponin  History:        Patient has no prior history of Echocardiogram examinations.                 Risk Factors:Current Smoker. Substance abuse.  Sonographer:    Ross Ludwig RDCS (AE) Referring Phys: [id number] Ples Specter  Sonographer Comments: Image acquisition challenging due to respiratory motion. IMPRESSIONS  1. Left ventricular ejection fraction, by estimation, is 55 to 60%. The left ventricle has normal function. The left ventricle has no regional wall motion abnormalities. There is mild left ventricular hypertrophy. Left ventricular diastolic parameters were normal.  2. Right ventricular systolic function is normal. The right ventricular size is normal. Tricuspid regurgitation signal is inadequate for assessing PA pressure.  3. The mitral valve is normal in  structure. Trivial mitral valve regurgitation. No evidence of mitral stenosis.  4. The aortic valve is tricuspid. Aortic valve regurgitation is not visualized. No aortic stenosis is present.  5. The inferior vena cava is normal in size with greater than 50% respiratory variability, suggesting right atrial pressure of 3 mmHg. FINDINGS  Left Ventricle: Left ventricular ejection fraction, by estimation, is 55 to 60%. The left ventricle has normal function. The left ventricle has no regional wall motion abnormalities. The left ventricular internal cavity size was normal in size. There is  mild left ventricular hypertrophy. Left ventricular diastolic parameters were normal. Right Ventricle: The right ventricular size is normal. Right ventricular systolic function is normal. Tricuspid regurgitation signal  is inadequate for assessing PA pressure. The tricuspid regurgitant velocity is 1.96 m/s, and with an assumed right atrial  pressure of 8 mmHg, the estimated right ventricular systolic pressure is 23.4 mmHg. Left Atrium: Left atrial size was normal in size. Right Atrium: Right atrial size was normal in size. Pericardium: There is no evidence of pericardial effusion. Mitral Valve: The mitral valve is normal in structure. Trivial mitral valve regurgitation. No evidence of mitral valve stenosis. MV peak gradient, 2.7 mmHg. The mean mitral valve gradient is 1.0 mmHg. Tricuspid Valve: The tricuspid valve is normal in structure. Tricuspid valve regurgitation is trivial. No evidence of tricuspid stenosis. Aortic Valve: The aortic valve is tricuspid. Aortic valve regurgitation is not visualized. No aortic stenosis is present. Aortic valve mean gradient measures 3.0 mmHg. Aortic valve peak gradient measures 5.2 mmHg. Aortic valve area, by VTI measures 2.18 cm. Pulmonic Valve: The pulmonic valve was normal in structure. Pulmonic valve regurgitation is not visualized. No evidence of pulmonic stenosis. Aorta: The aortic root is  normal in size and structure. Venous: The inferior vena cava is normal in size with greater than 50% respiratory variability, suggesting right atrial pressure of 3 mmHg. IAS/Shunts: No atrial level shunt detected by color flow Doppler.  LEFT VENTRICLE PLAX 2D LVIDd:         4.00 cm  Diastology LVIDs:         2.60 cm  LV e' medial:    10.40 cm/s LV PW:         1.20 cm  LV E/e' medial:  6.8 LV IVS:        1.40 cm  LV e' lateral:   12.60 cm/s LVOT diam:     1.90 cm  LV E/e' lateral: 5.6 LV SV:         50 LV SV Index:   27 LVOT Area:     2.84 cm  RIGHT VENTRICLE             IVC RV Basal diam:  2.90 cm     IVC diam: 2.00 cm RV S prime:     12.70 cm/s TAPSE (M-mode): 2.3 cm LEFT ATRIUM           Index       RIGHT ATRIUM           Index LA diam:      2.90 cm 1.57 cm/m  RA Area:     16.70 cm LA Vol (A2C): 31.3 ml 16.97 ml/m RA Volume:   41.10 ml  22.29 ml/m LA Vol (A4C): 38.1 ml 20.66 ml/m  AORTIC VALVE AV Area (Vmax):    2.27 cm AV Area (Vmean):   2.09 cm AV Area (VTI):     2.18 cm AV Vmax:           114.00 cm/s AV Vmean:          87.400 cm/s AV VTI:            0.229 m AV Peak Grad:      5.2 mmHg AV Mean Grad:      3.0 mmHg LVOT Vmax:         91.20 cm/s LVOT Vmean:        64.500 cm/s LVOT VTI:          0.176 m LVOT/AV VTI ratio: 0.77  AORTA Ao Root diam: 3.40 cm Ao Asc diam:  3.20 cm MITRAL VALVE  TRICUSPID VALVE MV Area (PHT): 2.37 cm    TR Peak grad:   15.4 mmHg MV Area VTI:   2.26 cm    TR Vmax:        196.00 cm/s MV Peak grad:  2.7 mmHg MV Mean grad:  1.0 mmHg    SHUNTS MV Vmax:       0.81 m/s    Systemic VTI:  0.18 m MV Vmean:      48.8 cm/s   Systemic Diam: 1.90 cm MV Decel Time: 320 msec MV E velocity: 70.60 cm/s MV A velocity: 41.60 cm/s MV E/A ratio:  1.70 Olga Millers MD Electronically signed by Olga Millers MD Signature Date/Time: 10/24/2020/11:34:04 AM    Final      Medications   Scheduled Meds: . aspirin  81 mg Oral Daily  . Chlorhexidine Gluconate Cloth  6 each Topical Daily   . thiamine  100 mg Intravenous Daily   Continuous Infusions: . sodium chloride 10 mL/hr at 10/24/20 1200       LOS: 2 days    Time spent: 30 minutes    Pennie Banter, DO [facility]  10/25/2020, 3:32 PM      If 7PM-7AM, please contact night-coverage. How to contact the [facility] Attending or Consulting provider 7A - 7P or covering provider during after hours 7P -7A, for this patient?    1. Check the care team in [facility] and look for a) attending/consulting [facility] provider listed and b) the [facility] team listed 2. Log into [email or web address] and use [facility] universal password to access. If you do not have the password, please contact the hospital operator. 3. Locate the [facility] provider you are looking for under [facility] and page to a number that you can be directly reached. 4. If you still have difficulty reaching the provider, please page the [facility] (Director on Call) for the Hospitalists listed on amion for assistance.

## 2020-10-25 NOTE — Evaluation (Signed)
Physical Therapy Evaluation Patient Details Name: Timothy Herring MRN: [id number] DOB: February 09, 1966 Today's Date: 10/25/2020   History of Present Illness  Pt is a 55 y/o male admitted 5/28 after being found down on a stranger's porch. Pt with white substance in mouth and was given narcan; possible overdose. Also found to have elevated troponin to be worked up as outpatient. PMH includes tobacco use.  Clinical Impression  Pt admitted secondary to problem above with deficits below. Reporting wooziness during ambulation which limited mobility this session. Required min guard to supervision for safety during gait and transfers. No overt LOB noted. Also noted decreased safety awareness during mobility tasks. Anticipate pt will progress well and will not require follow up PT services. Will continue to follow acutely.     Follow Up Recommendations No PT follow up    Equipment Recommendations  None recommended by PT    Recommendations for Other Services       Precautions / Restrictions Precautions Precautions: Fall Restrictions Weight Bearing Restrictions: No      Mobility  Bed Mobility Overal bed mobility: Modified Independent                  Transfers Overall transfer level: Needs assistance Equipment used: None Transfers: Sit to/from Stand Sit to Stand: Supervision         General transfer comment: Supervision for safety. Slightly impulsive and required cues to wait for PT.  Ambulation/Gait Ambulation/Gait assistance: Min guard Gait Distance (Feet): 125 Feet Assistive device: None Gait Pattern/deviations: Step-through pattern;Decreased stride length Gait velocity: Decreased   General Gait Details: Slow, cautious gait with mobility. Reporting increased "wooziness" which limited distance. Min guard for safety. No overt LOB noted.  Stairs            Wheelchair Mobility    Modified Rankin (Stroke Patients Only)       Balance Overall balance assessment:  Mild deficits observed, not formally tested                                           Pertinent Vitals/Pain Pain Assessment: No/denies pain    Home Living Family/patient expects to be discharged to:: Private residence Living Arrangements: Non-relatives/Friends (roommates) Available Help at Discharge: Friend(s);Available PRN/intermittently Type of Home: House Home Access: Stairs to enter Entrance Stairs-Rails: Doctor, general practice of Steps: 3-4 Home Layout: Two level Home Equipment: None      Prior Function Level of Independence: Independent               Hand Dominance        Extremity/Trunk Assessment   Upper Extremity Assessment Upper Extremity Assessment: Defer to OT evaluation    Lower Extremity Assessment Lower Extremity Assessment: Generalized weakness    Cervical / Trunk Assessment Cervical / Trunk Assessment: Normal  Communication   Communication: No difficulties  Cognition Arousal/Alertness: Awake/alert Behavior During Therapy: WFL for tasks assessed/performed Overall Cognitive Status: Impaired/Different from baseline Area of Impairment: Memory;Problem solving;Safety/judgement                     Memory: Decreased short-term memory   Safety/Judgement: Decreased awareness of safety;Decreased awareness of deficits   Problem Solving: Slow processing General Comments: Pt reporting conflicting information about home set up during session. Decreased safety awareness as trying to get up when IV pole was on other side.  General Comments General comments (skin integrity, edema, etc.): Pt's brother in law present during session    Exercises     Assessment/Plan    PT Assessment Patient needs continued PT services  PT Problem List Decreased strength;Decreased balance;Decreased mobility;Decreased activity tolerance;Decreased cognition;Decreased safety awareness;Decreased knowledge of precautions       PT  Treatment Interventions Gait training;Functional mobility training;Stair training;Therapeutic activities;Therapeutic exercise;Balance training    PT Goals (Current goals can be found in the Care Plan section)  Acute Rehab PT Goals Patient Stated Goal: to feel better and go home PT Goal Formulation: With patient Time For Goal Achievement: 11/08/20 Potential to Achieve Goals: Good    Frequency Min 3X/week   Barriers to discharge        Co-evaluation               AM-PAC PT "6 Clicks" Mobility  Outcome Measure Help needed turning from your back to your side while in a flat bed without using bedrails?: None Help needed moving from lying on your back to sitting on the side of a flat bed without using bedrails?: None Help needed moving to and from a bed to a chair (including a wheelchair)?: A Little Help needed standing up from a chair using your arms (e.g., wheelchair or bedside chair)?: A Little Help needed to walk in hospital room?: A Little Help needed climbing 3-5 steps with a railing? : A Little 6 Click Score: 20    End of Session Equipment Utilized During Treatment: Gait belt Activity Tolerance: Treatment limited secondary to medical complications (Comment) ("wooziness") Patient left: in bed;with call bell/phone within reach;with bed alarm set;with family/visitor present Nurse Communication: Mobility status PT Visit Diagnosis: Other abnormalities of gait and mobility (R26.89)    Time: 6606-3016 PT Time Calculation (min) (ACUTE ONLY): 14 min   Charges:   PT Evaluation $PT Eval Low Complexity: 1 Low          Cindee Salt, DPT  Acute Rehabilitation Services  Pager: [phone] Office: [phone]   Lehman Prom 10/25/2020, 2:04 PM

## 2020-10-25 NOTE — TOC Initial Note (Signed)
Transition of Care [facility]) - Initial/Assessment Note    Patient Details  Name: Timothy Herring MRN: [id number] Date of Birth: [birth year]  Transition [facility] Care [facility] CM/SW Contact:    Cristobal Goldmann, LCSW Phone Number: 10/25/2020, 3:30 PM  Clinical Narrative: CSW received a call from patient's sister Starleen Blue regarding her brother and his discharge plan. Ms. Audria Nine expressed concern regarding patient current drug use and recapped how patient was found on someone's door step unresponsive and brought to the hospital, and was positive for cocaine. The sister reported that her brother was living in a recovery home [place] a church on [address] and got the drugs he used from someone at the recovery house, and had a heart attack. Ms. Audria Nine expressed concern for her brother and indicated that he needs counseling. The sister also reported that patient works in [employer].  CSW actively listened and did not share any medical information regarding the patient.     CSW visited with [name] at the bedside. His brother-in-law (Cindy's husband) was at the bedside and patient did not express not wanting to talk with him in the room. Patient was open regarding his drug and alcohol use and reported that he has had problems with substance abuse all of his life. [name] reported that he currently works Monday - Friday and lives in a half-way house, and he pays rent. He added that no alcohol/drug services are provided. Patient expressed awareness that he needs drug rehab services, but added that he also needs to work and this was discussed in terms of going to an inpatient or outpatient drug treatment program. CSW provided inpatient and outpatient drug program information. CSW also talked with patient regarding one of the outpatient resources - [facility] and encouraged him to call them today regarding their Substance Abuse Intensive Outpatient Program to  determine if he could attend this program and continue to work.                Expected Discharge Plan: Home/Self Care (Home self care) Barriers to Discharge: Continued Medical Work up   Patient Goals and CMS Choice Patient states their goals for this hospitalization and ongoing recovery are:: Patient in agreement and wants drug rehab. CMS Medicare.gov Compare Post Acute Care list provided to:: Other (Comment Required) (Patient provided with Outpatient and Inpatient drug resources) Choice offered to / list presented to : NA  Expected Discharge Plan and Services Expected Discharge Plan: Home/Self Care (Home self care) In-house Referral: Clinical Social Work Discharge Planning Services: Other - See comment (Outpatient and Inpatient drug rehab programs)   Living arrangements for the past 2 months: Group Home (Patient currently living at a half-way house on [address].)                                     Prior Living Arrangements/Services Living arrangements for the past 2 months: Group Home (Patient currently living at a half-way house on [address] Lives with:: Other (Comment) (Half-way house) Patient language and need for interpreter reviewed:: No Do you feel safe going back to the place where you live?: Yes      Need for Family Participation in Patient Care: Yes (Comment) Care giver support system in place?: No (comment) Current home services: Other (comment) (No services provided) Criminal Activity/Legal Involvement Pertinent to Current Situation/Hospitalization: No - Comment as needed  Activities of  Daily Living      Permission Sought/Granted Permission sought to share information with : Other (comment) (Patient made aware that his siter Arline Asp called CSW)                Emotional Assessment Appearance:: Appears stated age Attitude/Demeanor/Rapport: Engaged Affect (typically observed): Appropriate   Alcohol / Substance Use: Alcohol Use,Illicit Drugs  (Patient reported history of and recent drug and alcohol use) Psych Involvement: No (comment)  Admission diagnosis:  Hypothermia, initial encounter [T68.XXXA] Septic shock (HCC) [A41.9, R65.21] Narcotic overdose, undetermined intent, initial encounter (HCC) [T40.604A] Hypotension, unspecified hypotension type [I95.9] Altered mental status, unspecified altered mental status type [R41.82] Patient Active Problem List   Diagnosis Date Noted  . Septic shock (HCC) 10/23/2020  . Hypothermia    PCP:  Pcp, No Pharmacy:   [id number] - [place], [place] - [address] AT [facility] [place] [address] Phone: [phone] Fax: [phone]     Social Determinants of Health (SDOH) Interventions  Patient has substance abuse problems with alcohol and drugs. Information was provided for outpatient and inpatient drug treatment programs.  Readmission Risk Interventions No flowsheet data found.

## 2020-10-26 DIAGNOSIS — E872 Acidosis: Secondary | ICD-10-CM

## 2020-10-26 DIAGNOSIS — G929 Unspecified toxic encephalopathy: Secondary | ICD-10-CM

## 2020-10-26 DIAGNOSIS — R570 Cardiogenic shock: Secondary | ICD-10-CM

## 2020-10-26 DIAGNOSIS — F17200 Nicotine dependence, unspecified, uncomplicated: Secondary | ICD-10-CM

## 2020-10-26 DIAGNOSIS — F1410 Cocaine abuse, uncomplicated: Secondary | ICD-10-CM

## 2020-10-26 DIAGNOSIS — R748 Abnormal levels of other serum enzymes: Secondary | ICD-10-CM

## 2020-10-26 DIAGNOSIS — F10129 Alcohol abuse with intoxication, unspecified: Secondary | ICD-10-CM

## 2020-10-26 DIAGNOSIS — F1910 Other psychoactive substance abuse, uncomplicated: Secondary | ICD-10-CM

## 2020-10-26 DIAGNOSIS — G9341 Metabolic encephalopathy: Secondary | ICD-10-CM

## 2020-10-26 LAB — HEPATIC FUNCTION PANEL
ALT: 155 U/L — ABNORMAL HIGH (ref 0–44)
AST: 100 U/L — ABNORMAL HIGH (ref 15–41)
Albumin: 3 g/dL — ABNORMAL LOW (ref 3.5–5.0)
Alkaline Phosphatase: 60 U/L (ref 38–126)
Bilirubin, Direct: 0.2 mg/dL (ref 0.0–0.2)
Indirect Bilirubin: 0.4 mg/dL (ref 0.3–0.9)
Total Bilirubin: 0.6 mg/dL (ref 0.3–1.2)
Total Protein: 5.6 g/dL — ABNORMAL LOW (ref 6.5–8.1)

## 2020-10-26 LAB — CBC
HCT: 33.5 % — ABNORMAL LOW (ref 39.0–52.0)
Hemoglobin: 11.1 g/dL — ABNORMAL LOW (ref 13.0–17.0)
MCH: 31 pg (ref 26.0–34.0)
MCHC: 33.1 g/dL (ref 30.0–36.0)
MCV: 93.6 fL (ref 80.0–100.0)
Platelets: 75 10*3/uL — ABNORMAL LOW (ref 150–400)
RBC: 3.58 MIL/uL — ABNORMAL LOW (ref 4.22–5.81)
RDW: 13.3 % (ref 11.5–15.5)
WBC: 6.5 10*3/uL (ref 4.0–10.5)
nRBC: 0 % (ref 0.0–0.2)

## 2020-10-26 LAB — HEMOGLOBIN A1C
Hgb A1c MFr Bld: 6 % — ABNORMAL HIGH (ref 4.8–5.6)
Mean Plasma Glucose: 126 mg/dL

## 2020-10-26 LAB — BASIC METABOLIC PANEL
Anion gap: 9 (ref 5–15)
BUN: 16 mg/dL (ref 6–20)
CO2: 26 mmol/L (ref 22–32)
Calcium: 8.4 mg/dL — ABNORMAL LOW (ref 8.9–10.3)
Chloride: 105 mmol/L (ref 98–111)
Creatinine, Ser: 1.24 mg/dL (ref 0.61–1.24)
GFR, Estimated: >60 mL/min (ref >60)
Glucose, Bld: 96 mg/dL (ref 70–99)
Potassium: 3.4 mmol/L — ABNORMAL LOW (ref 3.5–5.1)
Sodium: 140 mmol/L (ref 135–145)

## 2020-10-26 LAB — GLUCOSE, CAPILLARY: Glucose-Capillary: 131 mg/dL — ABNORMAL HIGH (ref 70–99)

## 2020-10-26 LAB — HEPARIN INDUCED PLATELET AB (HIT ANTIBODY): Heparin Induced Plt Ab: 0.053 OD (ref 0.000–0.400)

## 2020-10-26 LAB — MAGNESIUM: Magnesium: 1.9 mg/dL (ref 1.7–2.4)

## 2020-10-26 MED ORDER — ASPIRIN 81 MG PO CHEW: 81.0000 mg | CHEWABLE_TABLET | Freq: Every day | ORAL | 1 refills | Status: AC

## 2020-10-26 MED ORDER — POTASSIUM CHLORIDE CRYS ER 20 MEQ PO TBCR
40.0000 meq | EXTENDED_RELEASE_TABLET | ORAL | Status: AC
  Administered 2020-10-26 (×2): 40 meq via ORAL
  Filled 2020-10-26 (×2): qty 2

## 2020-10-26 NOTE — Progress Notes (Signed)
Occupational Therapy Evaluation Patient Details Name: Timothy Herring MRN: [id number] DOB: Mar 20, 1966 Today's Date: 10/26/2020    History of Present Illness Pt is a 55 y/o male admitted 5/28 after being found down on a stranger's porch. Pt with white substance in mouth and was given narcan; possible overdose. Also found to have elevated troponin to be worked up as outpatient. PMH includes tobacco use.   Clinical Impression   Pt presents with above diagnosis. PTA pt PLOF living at recovery house with roommates, mostly I with ADLs reports to still drive. Mini cog assessment administered to further assess cognition, however, no limitation observed during assessment with scoring of 5/5. Pt currently mod I to S for safety with ADLs and functional transfers. No further OT needs required at this time. DC recommendation to prior home set up. Thank you for the referral.     Follow Up Recommendations  No OT follow up    Equipment Recommendations       Recommendations for Other Services       Precautions / Restrictions Precautions Precautions: Fall Restrictions Weight Bearing Restrictions: No      Mobility Bed Mobility Overal bed mobility: Modified Independent                  Transfers Overall transfer level: Needs assistance Equipment used: None Transfers: Sit to/from Stand Sit to Stand: Supervision         General transfer comment: Supervision for safety. .    Balance Overall balance assessment: Mild deficits observed, not formally tested                                         ADL either performed or assessed with clinical judgement   ADL Overall ADL's : Needs assistance/impaired Eating/Feeding: Independent;Sitting   Grooming: Wash/dry hands;Wash/dry face;Oral care;Set up;Sitting   Upper Body Bathing: Set up;Sitting   Lower Body Bathing: Set up;Sit to/from stand   Upper Body Dressing : Modified independent;Sitting   Lower Body Dressing:  Modified independent;Sit to/from stand   Toilet Transfer: Retail banker;Ambulation   Toileting- Clothing Manipulation and Hygiene: Supervision/safety;Sit to/from stand       Functional mobility during ADLs: Supervision/safety General ADL Comments: no LOB observed during toilet transfer from bed to toilet. Pt able to perform LB dressing and functional transfers with no physical assistance required.     Vision         Perception     Praxis      Pertinent Vitals/Pain       Hand Dominance     Extremity/Trunk Assessment Upper Extremity Assessment Upper Extremity Assessment: Generalized weakness   Lower Extremity Assessment Lower Extremity Assessment: Defer to PT evaluation   Cervical / Trunk Assessment Cervical / Trunk Assessment: Normal   Communication Communication Communication: No difficulties   Cognition Arousal/Alertness: Awake/alert Behavior During Therapy: WFL for tasks assessed/performed Overall Cognitive Status: Impaired/Different from baseline Area of Impairment: Memory;Problem solving;Safety/judgement                     Memory: Decreased short-term memory   Safety/Judgement: Decreased awareness of safety;Decreased awareness of deficits   Problem Solving: Slow processing General Comments: mini cog performed for further cog assesement, pt scored 5/5 with no limitations in recall of ward or drawing clock. Pt was disoriented to day, reports it being May 24th, however, knew memorial day just past.  General Comments       Exercises     Shoulder Instructions      Home Living Family/patient expects to be discharged to:: Other (Comment) Living Arrangements: Non-relatives/Friends;Other (Comment) (reports living in boarding/recovery home.) Available Help at Discharge: Friend(s);Available PRN/intermittently Type of Home: House Home Access: Stairs to enter [employer] of Steps: 3-4 Entrance Stairs-Rails:  Right;Left Home Layout: Two level Alternate Level Stairs-Number of Steps: flight Alternate Level Stairs-Rails: Right Bathroom Shower/Tub: Chief Strategy Officer: Standard     Home Equipment: None          Prior Functioning/Environment Level of Independence: Independent                 OT Problem List: Decreased strength;Impaired balance (sitting and/or standing);Decreased cognition;Decreased safety awareness      OT Treatment/Interventions:      OT Goals(Current goals can be found in the care plan section) Acute Rehab OT Goals Patient Stated Goal: to feel better and go home  OT Frequency:     Barriers to D/C:            Co-evaluation              AM-PAC OT "6 Clicks" Daily Activity     Outcome Measure Help from another person eating meals?: None Help from another person taking care of personal grooming?: A Little Help from another person toileting, which includes using toliet, bedpan, or urinal?: None Help from another person bathing (including washing, rinsing, drying)?: A Little Help from another person to put on and taking off regular upper body clothing?: None Help from another person to put on and taking off regular lower body clothing?: None 6 Click Score: 22   End of Session Equipment Utilized During Treatment: Gait belt Nurse Communication: Mobility status  Activity Tolerance: Patient tolerated treatment well Patient left: in bed;with call bell/phone within reach;with nursing/sitter in room  OT Visit Diagnosis: Muscle weakness (generalized) (M62.81)                Time: 4403-4742 OT Time Calculation (min): 12 min Charges:  OT General Charges $OT Visit: 1 Visit OT Evaluation $OT Eval Low Complexity: 1 Low  Marquette Old, MSOT, OTR/L  Supplemental Rehabilitation Services  [phone]   Zigmund Daniel 10/26/2020, 12:14 PM

## 2020-10-26 NOTE — Discharge Summary (Signed)
Physician Discharge Summary  Timothy Herring IHK:[id number] DOB: 1966/02/06 DOA: 10/23/2020  PCP: Oneita Hurt, No  Admit date: 10/23/2020 Discharge date: 10/26/2020  Admitted From: Home Disposition: Home with sister  Recommendations for Outpatient Follow-up:  1. Follow ups as below. 2. Please obtain CBC/BMP/Mag at follow up 3. Please follow up on the following pending results: None  Home Health: None required Equipment/Devices: None required  Discharge Condition: Stable CODE STATUS: Full code   Follow-up Information    Jodelle Red, MD. Schedule an appointment as soon as possible for a visit in 1 week(s).   Specialty: Cardiology Contact information: [address] [place] [address] [place] [address] [phone]               Hospital Course: 55 year old M with PMH of polysubstance abuse (tobacco, alcohol, cocaine) brought to ED by EMS after he was found unresponsive on a strangers porch with agonal breathing and "white substance" in his mouth.  Reportedly, mental status improved after IM Narcan by EMS and he was transported to ER.  In ED, he was hypothermic and progressively became hypotensive.  Head imaging negative.  WBC 20 with mild left shift. Cr 2.13.  BUN 16.  Bicarb 17.  AST 381.  ALT 212.  UDS positive for cocaine.  Tylenol, salicylate and EtOH level within normal.  Portable CXR and pelvic x-ray without acute finding but osteoarthritis.  Lactic acid 6.4.  Troponin 1400.  COVID-19 and influenza PCR nonreactive.  Patient was admitted to ICU for encephalopathy with concern for "opiate overdose" and hypotension requiring vasopressors.  He was also started on empiric broad-spectrum antibiotics out of concern for septic shock.    Patient's troponin peaked at 2900.  Briefly started on IV heparin.  Cardiology consulted.  His TTE was without significant finding.  Heparin discontinued.  Cardiology recommended outpatient follow-up for further evaluation and signed off.   Patient's mental status and hemodynamics improved.  Remained stable off vasopressors.  Transferred to [facility] on 10/25/2020.  On the day of discharge, patient felt well.  He was free of chest pain, shortness of breath, focal neuro symptoms, GI or UTI symptoms.  Evaluated by therapy and no needles identified.  He was counseled on tobacco, alcohol and cocaine cessation.  He was provided with resource.  Patient will be staying with his sister after discharge.  Cardiology to arrange outpatient follow-up.  See individual problem list below for more hospital course.  Discharge Diagnoses:  Acute toxic metabolic encephalopathy-likely due to cocaine, EtOH and possible opiate overdose, as well as hypotension and lactic acidosis.  Resolved. -Counseled.  Hypotension/lactic acidosis/hypothermia-resolved.  Suspect cardiogenic shock versus septic shock in the setting of polysubstance use.  Resolved.  Sepsis ruled out.  No obvious source of infection.  Non-STEMI/elevated troponin-peaked at 2900.  Likely due to cocaine abuse.  TTE without significant finding.  Cardiology recommended outpatient follow-up. -Cardiology to arrange outpatient follow-up  Elevated liver enzymes-likely due to polysubstance and Tylenol.  Bilirubin within normal.  Improved. Recent Labs  Lab 10/23/20 1336 10/23/20 2000 10/24/20 0702 10/26/20 1218  AST 381* 611* 427* 100*  ALT 212* 332* 281* 155*  ALKPHOS 110 86 60 60  BILITOT 0.8 0.9 0.8 0.6  PROT 7.4 6.1* 5.6* 5.6*  ALBUMIN 4.1 3.6 3.2* 3.0*  -Recheck LFT at follow-up -Encouraged alcohol cessation.  AKI/azotemia/anion gap metabolic acidosis: Resolved.  Likely due to #2 Recent Labs    10/23/20 1336 10/23/20 2000 10/24/20 0004 10/25/20 0141 10/26/20 0230  BUN 16 28* 33*  34* 16  CREATININE 2.13* 1.99* 2.00* 1.52* 1.24  -Recheck renal function at follow-up.   Thrombocytopenia-likely due to alcohol abuse. Recent Labs  Lab 10/23/20 1336  10/24/20 0004 10/25/20 0141 10/26/20 0230  PLT 200 136* 79* 75*  -Recheck CBC at follow-up -Encouraged alcohol cessation.  Hypokalemia: K3.4. -Replenished prior to discharge.  Acute urinary retention-  resolved.  Polysubstance abuse -Counseled and resources provided.   Body mass index is 20.36 kg/m.            Discharge Exam: Vitals:   10/26/20 0504 10/26/20 0917  BP: 137/83 134/87  Pulse: 68 (!) 57  Resp: 18 18  Temp: 98.1 F (36.7 C) 98.4 F (36.9 C)  SpO2: 96% 97%    GENERAL: No apparent distress.  Nontoxic. HEENT: MMM.  Vision and hearing grossly intact.  NECK: Supple.  No apparent JVD.  RESP:  No IWOB.  Fair aeration bilaterally. CVS:  RRR. Heart sounds normal.  ABD/GI/GU: Bowel sounds present. Soft. Non tender.  MSK/EXT:  Moves extremities. No apparent deformity. No edema.  SKIN: no apparent skin lesion or wound NEURO: Awake, alert and oriented appropriately.  No apparent focal neuro deficit. PSYCH: Calm. Normal affect.   Discharge Instructions  Discharge Instructions    Call MD for:  difficulty breathing, headache or visual disturbances   Complete by: As directed    Call MD for:  extreme fatigue   Complete by: As directed    Call MD for:  persistant dizziness or light-headedness   Complete by: As directed    Call MD for:  temperature >100.4   Complete by: As directed    Diet general   Complete by: As directed    Discharge instructions   Complete by: As directed    It has been a pleasure taking care of you!  You were hospitalized with unresponsiveness, low blood pressure and acute kidney injury likely due to cocaine overdose.  It is very important that you avoid cocaine, alcohol or any recreational drugs.  We also recommend you avoid Tylenol as it could potentially affect your liver.  Please follow-up with your primary care doctor in 1 to 2 weeks to establish PCP care if he does not have 1 yet.   It is important that you quit smoking  cigarettes.  You may use nicotine patch to help you quit smoking.  Nicotine patch is available over-the-counter.  You may also discuss other options to help you quit smoking with your primary care doctor. You can also talk to professional counselors at 1-800-QUIT-NOW [phone]) for free smoking cessation counseling.     Take care,   Increase activity slowly   Complete by: As directed      Allergies as of 10/26/2020   No Known Allergies     Medication List    STOP taking these medications   acetaminophen 650 MG CR tablet Commonly known as: TYLENOL     TAKE these medications   aspirin 81 MG chewable tablet Chew 1 tablet (81 mg total) by mouth daily.       Consultations:  Critical care  Cardiology  Procedures/Studies:  2D Echo on 10/24/2020 1. Left ventricular ejection fraction, by estimation, is 55 to 60%. The  left ventricle has normal function. The left ventricle has no regional  wall motion abnormalities. There is mild left ventricular hypertrophy.  Left ventricular diastolic parameters  were normal.  2. Right ventricular systolic function is normal. The right ventricular  size is normal. Tricuspid regurgitation signal is  inadequate for assessing  PA pressure.  3. The mitral valve is normal in structure. Trivial mitral valve  regurgitation. No evidence of mitral stenosis.  4. The aortic valve is tricuspid. Aortic valve regurgitation is not  visualized. No aortic stenosis is present.  5. The inferior vena cava is normal in size with greater than 50%  respiratory variability, suggesting right atrial pressure of 3 mmHg.    CT ABDOMEN PELVIS WO CONTRAST  Result Date: 10/23/2020 CLINICAL DATA:  Possible overdose EXAM: CT ABDOMEN AND PELVIS WITHOUT CONTRAST TECHNIQUE: Multidetector CT imaging of the abdomen and pelvis was performed following the standard protocol without IV contrast. COMPARISON:  None. FINDINGS: Lower chest: Mild bibasilar atelectatic changes  are noted. Pectus excavatum is noted. Hepatobiliary: No focal liver abnormality is seen. No gallstones, gallbladder wall thickening, or biliary dilatation. Pancreas: Unremarkable. No pancreatic ductal dilatation or surrounding inflammatory changes. Spleen: Normal in size without focal abnormality. Adrenals/Urinary Tract: Adrenal glands are within normal limits. Kidneys are well visualized bilaterally. No renal calculi or obstructive changes are noted. Ureters are within normal limits. Bladder is well distended. Stomach/Bowel: Colon shows no obstructive or inflammatory changes. Mild retained fecal material is noted. The appendix is within normal limits. Small bowel and stomach are unremarkable. Vascular/Lymphatic: Aortic atherosclerosis. No enlarged abdominal or pelvic lymph nodes. Reproductive: Prostate is unremarkable. Other: No abdominal wall hernia or abnormality. No abdominopelvic ascites. Musculoskeletal: Degenerative changes of lumbar spine are noted. IMPRESSION: Mild bibasilar atelectatic changes. No other focal abnormality is noted. Electronically Signed   By: Alcide Clever M.D.   On: 10/23/2020 20:19   CT Head Wo Contrast  Result Date: 10/23/2020 CLINICAL DATA:  Fall EXAM: CT HEAD WITHOUT CONTRAST TECHNIQUE: Contiguous axial images were obtained from the base of the skull through the vertex without intravenous contrast. COMPARISON:  None. FINDINGS: Brain: No evidence of acute infarction, hemorrhage, hydrocephalus, extra-axial collection or mass lesion/mass effect. Vascular: No hyperdense vessel or unexpected calcification. Skull: Normal. Negative for fracture or focal lesion. Sinuses/Orbits: No acute finding. Other: None. IMPRESSION: No acute intracranial pathology. Electronically Signed   By: Lauralyn Primes M.D.   On: 10/23/2020 15:00   CT Cervical Spine Wo Contrast  Result Date: 10/23/2020 CLINICAL DATA:  Recent fall with neck pain, initial encounter EXAM: CT CERVICAL SPINE WITHOUT CONTRAST  TECHNIQUE: Multidetector CT imaging of the cervical spine was performed without intravenous contrast. Multiplanar CT image reconstructions were also generated. COMPARISON:  None. FINDINGS: Alignment: Within normal limits. Skull base and vertebrae: 7 cervical segments are well visualized. Vertebral body height is well maintained. No acute fracture or acute facet abnormality is noted. Multilevel facet hypertrophic changes are seen as well as multilevel osteophytic changes. Soft tissues and spinal canal: Surrounding soft tissue structures are within normal limits. Upper chest: Visualized lung apices demonstrate some interstitial thickening of uncertain significance. This may represent some mild edema. Other: None IMPRESSION: Degenerative changes of the cervical spine without acute bony abnormality. Mild interstitial thickening in the apices which may represent some mild pulmonary edema. Electronically Signed   By: Alcide Clever M.D.   On: 10/23/2020 15:27   DG Pelvis Portable  Result Date: 10/23/2020 CLINICAL DATA:  Drug overdose.  Fall. EXAM: PORTABLE PELVIS 1-2 VIEWS COMPARISON:  None. FINDINGS: No fracture or bone lesion. Bilateral superolateral hip joint space narrowing, moderate on the right and mild on the left. Marginal osteophytes project from the bases of both femoral heads. The SI joints and symphysis pubis are normally spaced and aligned. Soft tissues are  unremarkable. IMPRESSION: 1. No fracture or acute finding. 2. Right greater than left hip joint degenerative changes. Electronically Signed   By: Amie Portland M.D.   On: 10/23/2020 13:56   DG Chest Portable 1 View  Result Date: 10/23/2020 CLINICAL DATA:  Drug overdose.  Fall. EXAM: PORTABLE CHEST 1 VIEW COMPARISON:  06/23/2004. FINDINGS: Cardiac silhouette normal in size and configuration. Normal mediastinal and hilar contours. Clear lungs.  No pleural effusion or pneumothorax. Skeletal structures are grossly intact. IMPRESSION: No active disease.  Electronically Signed   By: Amie Portland M.D.   On: 10/23/2020 13:55   ECHOCARDIOGRAM COMPLETE  Result Date: 10/24/2020    ECHOCARDIOGRAM REPORT   Patient Name:   Timothy Herring Date of Exam: 10/24/2020 Medical Rec #:  [id number]         Height:       71.0 in Accession #:    [id number]        Weight:       145.9 lb Date of Birth:  [birth year]         BSA:          1.844 m Patient Age:    54 years          BP:           107/71 mmHg Patient Gender: M                 HR:           79 bpm. Exam Location:  Inpatient Procedure: 2D Echo, Cardiac Doppler and Color Doppler Indications:    Elevated troponin  History:        Patient has no prior history of Echocardiogram examinations.                 Risk Factors:Current Smoker. Substance abuse.  Sonographer:    Ross Ludwig RDCS (AE) Referring Phys: [id number] Ples Specter  Sonographer Comments: Image acquisition challenging due to respiratory motion. IMPRESSIONS  1. Left ventricular ejection fraction, by estimation, is 55 to 60%. The left ventricle has normal function. The left ventricle has no regional wall motion abnormalities. There is mild left ventricular hypertrophy. Left ventricular diastolic parameters were normal.  2. Right ventricular systolic function is normal. The right ventricular size is normal. Tricuspid regurgitation signal is inadequate for assessing PA pressure.  3. The mitral valve is normal in structure. Trivial mitral valve regurgitation. No evidence of mitral stenosis.  4. The aortic valve is tricuspid. Aortic valve regurgitation is not visualized. No aortic stenosis is present.  5. The inferior vena cava is normal in size with greater than 50% respiratory variability, suggesting right atrial pressure of 3 mmHg. FINDINGS  Left Ventricle: Left ventricular ejection fraction, by estimation, is 55 to 60%. The left ventricle has normal function. The left ventricle has no regional wall motion abnormalities. The left ventricular internal cavity size was  normal in size. There is  mild left ventricular hypertrophy. Left ventricular diastolic parameters were normal. Right Ventricle: The right ventricular size is normal. Right ventricular systolic function is normal. Tricuspid regurgitation signal is inadequate for assessing PA pressure. The tricuspid regurgitant velocity is 1.96 m/s, and with an assumed right atrial  pressure of 8 mmHg, the estimated right ventricular systolic pressure is 23.4 mmHg. Left Atrium: Left atrial size was normal in size. Right Atrium: Right atrial size was normal in size. Pericardium: There is no evidence of pericardial effusion. Mitral Valve: The mitral valve is normal in structure. Trivial  mitral valve regurgitation. No evidence of mitral valve stenosis. MV peak gradient, 2.7 mmHg. The mean mitral valve gradient is 1.0 mmHg. Tricuspid Valve: The tricuspid valve is normal in structure. Tricuspid valve regurgitation is trivial. No evidence of tricuspid stenosis. Aortic Valve: The aortic valve is tricuspid. Aortic valve regurgitation is not visualized. No aortic stenosis is present. Aortic valve mean gradient measures 3.0 mmHg. Aortic valve peak gradient measures 5.2 mmHg. Aortic valve area, by VTI measures 2.18 cm. Pulmonic Valve: The pulmonic valve was normal in structure. Pulmonic valve regurgitation is not visualized. No evidence of pulmonic stenosis. Aorta: The aortic root is normal in size and structure. Venous: The inferior vena cava is normal in size with greater than 50% respiratory variability, suggesting right atrial pressure of 3 mmHg. IAS/Shunts: No atrial level shunt detected by color flow Doppler.  LEFT VENTRICLE PLAX 2D LVIDd:         4.00 cm  Diastology LVIDs:         2.60 cm  LV e' medial:    10.40 cm/s LV PW:         1.20 cm  LV E/e' medial:  6.8 LV IVS:        1.40 cm  LV e' lateral:   12.60 cm/s LVOT diam:     1.90 cm  LV E/e' lateral: 5.6 LV SV:         50 LV SV Index:   27 LVOT Area:     2.84 cm  RIGHT VENTRICLE              IVC RV Basal diam:  2.90 cm     IVC diam: 2.00 cm RV S prime:     12.70 cm/s TAPSE (M-mode): 2.3 cm LEFT ATRIUM           Index       RIGHT ATRIUM           Index LA diam:      2.90 cm 1.57 cm/m  RA Area:     16.70 cm LA Vol (A2C): 31.3 ml 16.97 ml/m RA Volume:   41.10 ml  22.29 ml/m LA Vol (A4C): 38.1 ml 20.66 ml/m  AORTIC VALVE AV Area (Vmax):    2.27 cm AV Area (Vmean):   2.09 cm AV Area (VTI):     2.18 cm AV Vmax:           114.00 cm/s AV Vmean:          87.400 cm/s AV VTI:            0.229 m AV Peak Grad:      5.2 mmHg AV Mean Grad:      3.0 mmHg LVOT Vmax:         91.20 cm/s LVOT Vmean:        64.500 cm/s LVOT VTI:          0.176 m LVOT/AV VTI ratio: 0.77  AORTA Ao Root diam: 3.40 cm Ao Asc diam:  3.20 cm MITRAL VALVE               TRICUSPID VALVE MV Area (PHT): 2.37 cm    TR Peak grad:   15.4 mmHg MV Area VTI:   2.26 cm    TR Vmax:        196.00 cm/s MV Peak grad:  2.7 mmHg MV Mean grad:  1.0 mmHg    SHUNTS MV Vmax:       0.81 m/s  Systemic VTI:  0.18 m MV Vmean:      48.8 cm/s   Systemic Diam: 1.90 cm MV Decel Time: 320 msec MV E velocity: 70.60 cm/s MV A velocity: 41.60 cm/s MV E/A ratio:  1.70 Olga Millers MD Electronically signed by Olga Millers MD Signature Date/Time: 10/24/2020/11:34:04 AM    Final         The results of significant diagnostics from this hospitalization (including imaging, microbiology, ancillary and laboratory) are listed below for reference.     Microbiology: Recent Results (from the past 240 hour(s))  Culture, blood (routine x 2)     Status: None (Preliminary result)   Collection Time: 10/23/20  2:33 PM   Specimen: BLOOD  Result Value Ref Range Status   Specimen Description BLOOD SITE NOT SPECIFIED  Final   Special Requests   Final    BOTTLES DRAWN AEROBIC AND ANAEROBIC Blood Culture adequate volume   Culture   Final    NO GROWTH 3 DAYS Performed at [facility], [address], [place], [place] [address]    Report Status PENDING   Incomplete  Culture, blood (routine x 2)     Status: None (Preliminary result)   Collection Time: 10/23/20  2:38 PM   Specimen: BLOOD  Result Value Ref Range Status   Specimen Description BLOOD SITE NOT SPECIFIED  Final   Special Requests   Final    BOTTLES DRAWN AEROBIC AND ANAEROBIC Blood Culture adequate volume   Culture   Final    NO GROWTH 3 DAYS Performed at [facility], [address] [place], [place] [address]    Report Status PENDING  Incomplete  Resp Panel by RT-PCR (Flu A&B, Covid) Nasopharyngeal Swab     Status: None   Collection Time: 10/23/20  4:54 PM   Specimen: Nasopharyngeal Swab; Nasopharyngeal(NP) swabs in vial transport medium  Result Value Ref Range Status   SARS Coronavirus 2 by RT PCR NEGATIVE NEGATIVE Final    Comment: (NOTE) SARS-CoV-2 target nucleic acids are NOT DETECTED.  The SARS-CoV-2 RNA is generally detectable in upper respiratory specimens during the acute phase of infection. The lowest concentration of SARS-CoV-2 viral copies this assay can detect is 138 copies/mL. A negative result does not preclude SARS-Cov-2 infection and should not be used as the sole basis for treatment or other patient management decisions. A negative result may occur with  improper specimen collection/handling, submission of specimen other than nasopharyngeal swab, presence of viral mutation(s) within the areas targeted by this assay, and inadequate number of viral copies(<138 copies/mL). A negative result must be combined with clinical observations, patient history, and epidemiological information. The expected result is Negative.  Fact Sheet for Patients:  BloggerCourse.com  Fact Sheet for Healthcare Providers:  SeriousBroker.it  This test is no t yet approved or cleared by the [place] FDA and  has been authorized for detection and/or diagnosis of SARS-CoV-2 by FDA under an Emergency Use Authorization  (EUA). This EUA will remain  in effect (meaning this test can be used) for the duration of the COVID-19 declaration under Section 564(b)(1) of the Act, 21 U.S.C.section 360bbb-3(b)(1), unless the authorization is terminated  or revoked sooner.       Influenza A by PCR NEGATIVE NEGATIVE Final   Influenza B by PCR NEGATIVE NEGATIVE Final    Comment: (NOTE) The Xpert Xpress SARS-CoV-2/FLU/RSV plus assay is intended as an aid in the diagnosis of influenza from Nasopharyngeal swab specimens and should not be used  as a sole basis for treatment. Nasal washings and aspirates are unacceptable for Xpert Xpress SARS-CoV-2/FLU/RSV testing.  Fact Sheet for Patients: BloggerCourse.com  Fact Sheet for Healthcare Providers: SeriousBroker.it  This test is not yet approved or cleared by the [place] FDA and has been authorized for detection and/or diagnosis of SARS-CoV-2 by FDA under an Emergency Use Authorization (EUA). This EUA will remain in effect (meaning this test can be used) for the duration of the COVID-19 declaration under Section 564(b)(1) of the Act, 21 U.S.C. section 360bbb-3(b)(1), unless the authorization is terminated or revoked.  Performed at [facility], [address] [place], [place] [address]   MRSA PCR Screening     Status: None   Collection Time: 10/23/20 11:30 PM   Specimen: Nasopharyngeal  Result Value Ref Range Status   MRSA by PCR NEGATIVE NEGATIVE Final    Comment:        The GeneXpert MRSA Assay (FDA approved for NASAL specimens only), is one component of a comprehensive MRSA colonization surveillance program. It is not intended to diagnose MRSA infection nor to guide or monitor treatment for MRSA infections. Performed at [facility], [address] [place], [place] [address]      Labs:  CBC: Recent Labs  Lab 10/23/20 1336 10/24/20 0004 10/25/20 0141 10/26/20 0230  WBC 19.9*  23.1* 9.7 6.5  NEUTROABS 15.0*  --   --   --   HGB 15.3 13.2 11.3* 11.1*  HCT 50.2 40.8 33.8* 33.5*  MCV 101.6* 96.7 95.2 93.6  PLT 200 136* 79* 75*   BMP &GFR Recent Labs  Lab 10/23/20 1336 10/23/20 1452 10/23/20 2000 10/24/20 0004 10/25/20 0141 10/26/20 0230  NA 144  --  142 142 137 140  K 4.7  --  5.3* 4.8 3.5 3.4*  CL 105  --  108 110 105 105  CO2 17*  --  22 21* 26 26  GLUCOSE 127*  --  122* 136* 90 96  BUN 16  --  28* 33* 34* 16  CREATININE 2.13*  --  1.99* 2.00* 1.52* 1.24  CALCIUM 8.9  --  7.1* 7.3* 7.7* 8.4*  MG  --  3.3*  --  2.0  --  1.9  PHOS  --   --   --  5.0*  --   --    Estimated Creatinine Clearance: 62.3 mL/min (by C-G formula based on SCr of 1.24 mg/dL). Liver & Pancreas: Recent Labs  Lab 10/23/20 1336 10/23/20 2000 10/24/20 0702 10/26/20 1218  AST 381* 611* 427* 100*  ALT 212* 332* 281* 155*  ALKPHOS 110 86 60 60  BILITOT 0.8 0.9 0.8 0.6  PROT 7.4 6.1* 5.6* 5.6*  ALBUMIN 4.1 3.6 3.2* 3.0*   No results for input(s): LIPASE, AMYLASE in the last 168 hours. No results for input(s): AMMONIA in the last 168 hours. Diabetic: Recent Labs    10/24/20 1400  HGBA1C 6.0*   Recent Labs  Lab 10/23/20 2311 10/24/20 0329 10/24/20 0718 10/24/20 1203 10/24/20 1505  GLUCAP 155* 121* 113* 84 140*   Cardiac Enzymes: Recent Labs  Lab 10/23/20 1944 10/23/20 2118  CKTOTAL 599* 635*   No results for input(s): PROBNP in the last 8760 hours. Coagulation Profile: Recent Labs  Lab 10/23/20 2059 10/24/20 0951  INR 1.5* 1.4*   Thyroid Function Tests: Recent Labs    10/23/20 1838  TSH 0.990   Lipid Profile: Recent Labs    10/24/20 1436  CHOL 122  HDL 42  LDLCALC 76  TRIG 20  CHOLHDL 2.9   Anemia Panel: Recent Labs    10/23/20 1836 10/23/20 1838 10/23/20 1839  VITAMINB12 700  --   --   FOLATE  --   --  21.0  FERRITIN 3,337*  --   --   TIBC 336  --   --   IRON 118  --   --   RETICCTPCT  --  1.3  --    Urine analysis: No results  found for: COLORURINE, APPEARANCEUR, LABSPEC, PHURINE, GLUCOSEU, HGBUR, BILIRUBINUR, KETONESUR, PROTEINUR, UROBILINOGEN, NITRITE, LEUKOCYTESUR Sepsis Labs: Invalid input(s): PROCALCITONIN, LACTICIDVEN   Time coordinating discharge: 45 minutes  SIGNED:  Almon Hercules, MD  [facility] 10/26/2020, 5:35 PM  If 7PM-7AM, please contact night-coverage [email or web address]

## 2020-10-26 NOTE — Progress Notes (Signed)
DISCHARGE NOTE HOME JUDGE DUQUE to be discharged home per MD order. Discussed prescriptions and follow up appointments with the patient. Prescriptions given to patient; medication list explained in detail. Patient verbalized understanding.  Skin clean, dry and intact without evidence of skin break down, no evidence of skin tears noted. IV catheter discontinued intact. Site without signs and symptoms of complications. Dressing and pressure applied. Pt denies pain at the site currently. No complaints noted.  Patient free of lines, drains, and wounds.   An After Visit Summary (AVS) was printed and given to the patient. Patient escorted via wheelchair, and discharged home via private auto.  Delesha Pohlman S Leira Regino, RN

## 2020-10-27 ENCOUNTER — Telehealth: Payer: Self-pay

## 2020-10-27 NOTE — Telephone Encounter (Signed)
Called pt to schedule hospital follow up with Dr. Cristal Deer. Left message to call back.

## 2020-10-27 NOTE — Telephone Encounter (Signed)
Spoke to pt. Appointment scheduled for 6/9 with Dr. Cristal Deer.

## 2020-10-28 LAB — CULTURE, BLOOD (ROUTINE X 2)
Culture: NO GROWTH
Culture: NO GROWTH
Special Requests: ADEQUATE
Special Requests: ADEQUATE

## 2020-11-04 ENCOUNTER — Encounter: Payer: Self-pay | Admitting: Cardiology

## 2020-11-04 ENCOUNTER — Ambulatory Visit (INDEPENDENT_AMBULATORY_CARE_PROVIDER_SITE_OTHER): Payer: BC Managed Care – PPO | Admitting: Cardiology

## 2020-11-04 ENCOUNTER — Other Ambulatory Visit: Payer: Self-pay

## 2020-11-04 VITALS — BP 118/80 | HR 55 | Ht 71.0 in | Wt 148.2 lb

## 2020-11-04 DIAGNOSIS — Z8249 Family history of ischemic heart disease and other diseases of the circulatory system: Secondary | ICD-10-CM | POA: Diagnosis not present

## 2020-11-04 DIAGNOSIS — R079 Chest pain, unspecified: Secondary | ICD-10-CM

## 2020-11-04 DIAGNOSIS — Z716 Tobacco abuse counseling: Secondary | ICD-10-CM | POA: Diagnosis not present

## 2020-11-04 DIAGNOSIS — Z7189 Other specified counseling: Secondary | ICD-10-CM | POA: Diagnosis not present

## 2020-11-04 DIAGNOSIS — Z09 Encounter for follow-up examination after completed treatment for conditions other than malignant neoplasm: Secondary | ICD-10-CM | POA: Diagnosis not present

## 2020-11-04 NOTE — Progress Notes (Signed)
Cardiology Office Note:    Date:  11/05/2020   ID:  Timothy Herring, DOB 05/03/1966, MRN [id number]  PCP:  Cranford, Tonya, NP  Cardiologist:  Bridgette Christopher, MD  Referring MD: No ref. provider found   CC: post hospital follow up   History of Present Illness:    Timothy Herring is a 55 y.o. male with a hx of recent hospitalization (see below) who is seen for post hospital follow up.  I met [name] in the hospital on 10/24/20. We were asked to see him due to elevated troponins. This was in the setting of being found down, with acute kidney injury and acute hepatic injury. His echo was reassuringly normal. He does have a family history of CAD. Sister is a nurse.  Today: Is seeing a new PCP in [place], seen once and has pending follow up. Had labs done in [place]. Referred to GI in [place] to follow up LFTs.  Reviewed labs through [facility]. Dated 10/26/20. Cr 1.24, ALT 155. Platelets 75.  Has had some dull chest pain, comes and goes. Notices most when he lays on side. No associated symptoms. Has noticed in the past. Not worse with exertion.   Has been a smoker since he was 55 years old. Also drinks a lot of 5 hour energy, Monster energy drinks, etc.   Was an eye opening experience. Had used substances intermittently, but has never had a medical emergency like this before. No alcohol or substances since he has left the hospital. Looking for networks/support at home to prevent future issues.  Past Medical History:  Diagnosis Date   Substance abuse (HCC)    Tobacco abuse     History reviewed. No pertinent surgical history.  Current Medications: Current Outpatient Medications on File Prior to Visit  Medication Sig   aspirin 81 MG chewable tablet Chew 1 tablet (81 mg total) by mouth daily.   No current facility-administered medications on file prior to visit.     Allergies:   Patient has no known allergies.   Social History   Tobacco Use   Smoking status: Every  Day    Pack years: 0.00    Types: Cigarettes   Smokeless tobacco: Never  Substance Use Topics   Alcohol use: Not Currently   Drug use: Not Currently    Comment: cocaine use, denies since hospitalization 09/2020    Family History: family history is not on file.  ROS:   Please see the history of present illness.  Additional pertinent ROS: Constitutional: Negative for chills, fever, night sweats, unintentional weight loss  HENT: Negative for ear pain and hearing loss.   Eyes: Negative for loss of vision and eye pain.  Respiratory: Negative for cough, sputum, wheezing.   Cardiovascular: See HPI. Gastrointestinal: Negative for abdominal pain, melena, and hematochezia.  Genitourinary: Negative for dysuria and hematuria.  Musculoskeletal: Negative for falls and myalgias.  Skin: Negative for itching and rash.  Neurological: Negative for focal weakness, focal sensory changes and loss of consciousness.  Endo/Heme/Allergies: Does not bruise/bleed easily.  Has low platelets on recent labs.   EKGs/Labs/Other Studies Reviewed:    The following studies were reviewed today: Echo 10/24/20 1. Left ventricular ejection fraction, by estimation, is 55 to 60%. The  left ventricle has normal function. The left ventricle has no regional  wall motion abnormalities. There is mild left ventricular hypertrophy.  Left ventricular diastolic parameters  were normal.   2. Right ventricular systolic function is normal. The right ventricular    size is normal. Tricuspid regurgitation signal is inadequate for assessing  PA pressure.   3. The mitral valve is normal in structure. Trivial mitral valve  regurgitation. No evidence of mitral stenosis.   4. The aortic valve is tricuspid. Aortic valve regurgitation is not  visualized. No aortic stenosis is present.   5. The inferior vena cava is normal in size with greater than 50%  respiratory variability, suggesting right atrial pressure of 3 mmHg.   EKG:  EKG is  personally reviewed.  The ekg ordered 10/24/20 demonstrates NSR, iRBBB  Recent Labs: 10/23/2020: TSH 0.990 10/26/2020: ALT 155; BUN 16; Creatinine, Ser 1.24; Hemoglobin 11.1; Magnesium 1.9; Platelets 75; Potassium 3.4; Sodium 140  Recent Lipid Panel    Component Value Date/Time   CHOL 122 10/24/2020 1436   TRIG 20 10/24/2020 1436   HDL 42 10/24/2020 1436   CHOLHDL 2.9 10/24/2020 1436   VLDL 4 10/24/2020 1436   LDLCALC 76 10/24/2020 1436    Physical Exam:    VS:  BP 118/80   Pulse (!) 55   Ht 5' 11" (1.803 m)   Wt 148 lb 3.2 oz (67.2 kg)   SpO2 95%   BMI 20.67 kg/m     Wt Readings from Last 3 Encounters:  11/04/20 148 lb 3.2 oz (67.2 kg)  10/24/20 145 lb 15.1 oz (66.2 kg)    GEN: Well nourished, well developed in no acute distress HEENT: Normal, moist mucous membranes NECK: No JVD CARDIAC: regular rhythm, normal S1 and S2, no rubs or gallops. No murmur. VASCULAR: Radial and DP pulses 2+ bilaterally. No carotid bruits RESPIRATORY:  Clear to auscultation without rales, wheezing or rhonchi  ABDOMEN: Soft, non-tender, non-distended MUSCULOSKELETAL:  Ambulates independently SKIN: Warm and dry, no edema NEUROLOGIC:  Alert and oriented x 3. No focal neuro deficits noted. PSYCHIATRIC:  Normal affect    ASSESSMENT:    1. Chest pain of uncertain etiology   2. Family history of heart disease   3. Cardiac risk counseling   4. Counseling on health promotion and disease prevention   5. Hospital discharge follow-up   6. Tobacco abuse counseling    PLAN:    Chest pain -atypical in nature, but has risk factors -would avoid contrast with recent renal injury, though improved on recent labs -after shared decision making, will proceed with exercise treadmill stress test -counseled on red flag warning signs that need immediate medical attention Shared Decision Making/Informed Consent The risks [chest pain, shortness of breath, cardiac arrhythmias, dizziness, blood pressure  fluctuations, myocardial infarction, stroke/transient ischemic attack, and life-threatening complications (estimated to be 1 in 10,000)], benefits (risk stratification, diagnosing coronary artery disease, treatment guidance) and alternatives of an exercise tolerance test were discussed in detail with Mr. Nilsson and he agrees to proceed.   Elevated troponin, post hospital follow up -we discussed his hospitalization. Discussed that elevated troponins were secondary to his critical illness. Echo was normal. Return to work should be cleared through PCP, no limitations from cardiac perspective. Was told from discharge team at hospital to restart light duty. This seems reasonable but defer to PCP. -tolerating aspirin -would start statin once LFTs normalize, following labs with PCP.   Tobacco use: The patient was counseled on tobacco cessation today for 4 minutes.  Counseling included reviewing the risks of smoking tobacco products, how it impacts the patient's current medical diagnoses and different strategies for quitting.  Pharmacotherapy to aid in tobacco cessation was not prescribed today. Trying to quit on his own.  Cardiac risk counseling and prevention recommendations: with family history of heart disease -recommend heart healthy/Mediterranean diet, with whole grains, fruits, vegetable, fish, lean meats, nuts, and olive oil. Limit salt. -recommend moderate walking, 3-5 times/week for 30-50 minutes each session. Aim for at least 150 minutes.week. Goal should be pace of 3 miles/hours, or walking 1.5 miles in 30 minutes -recommend avoidance of tobacco products. Avoid excess alcohol. -ASCVD risk score: The ASCVD Risk score Mikey Bussing DC Jr., et al., 2013) failed to calculate for the following reasons:   The patient has a prior MI or stroke diagnosis    Plan for follow up: if ETT normal, would follow up in 1 year  Buford Dresser, MD, PhD, [facility]    Medication  Adjustments/Labs and Tests Ordered: Current medicines are reviewed at length with the patient today.  Concerns regarding medicines are outlined above.  Orders Placed This Encounter  Procedures   Cardiac Stress Test: Informed Consent Details: Physician/Practitioner Attestation; Transcribe to consent form and obtain patient signature   EXERCISE TOLERANCE TEST (ETT)    No orders of the defined types were placed in this encounter.   Patient Instructions  Medication Instructions:  Continue current medications  *If you need a refill on your cardiac medications before your next appointment, please call your pharmacy*   Lab Work: None Ordered   Testing/Procedures: Your physician has requested that you have an exercise tolerance test. For further information please visit [email or web address]. Please also follow instruction sheet, as given.  Follow-Up: At [facility], you and your health needs are our priority.  As part of our continuing mission to provide you with exceptional heart care, we have created designated Provider Care Teams.  These Care Teams include your primary Cardiologist (physician) and Advanced Practice Providers (APPs -  Physician Assistants and Nurse Practitioners) who all work together to provide you with the care you need, when you need it.  We recommend signing up for the patient portal called "MyChart".  Sign up information is provided on this After Visit Summary.  MyChart is used to connect with patients for Virtual Visits (Telemedicine).  Patients are able to view lab/test results, encounter notes, upcoming appointments, etc.  Non-urgent messages can be sent to your provider as well.   To learn more about what you can do with MyChart, go to [email or web address].    Your next appointment:   1 year(s)  The format for your next appointment:   In Person  Provider:   You may see Buford Dresser, MD or one of the following Advanced Practice Providers on your  designated Care Team:   Rosaria Ferries, PA-C Jory Sims, DNP, ANP    Signed, Buford Dresser, MD PhD 11/05/2020 11:59 AM    [place]

## 2020-11-04 NOTE — Patient Instructions (Signed)
Medication Instructions:  Continue current medications  *If you need a refill on your cardiac medications before your next appointment, please call your pharmacy*   Lab Work: None Ordered   Testing/Procedures: Your physician has requested that you have an exercise tolerance test. For further information please visit [email or web address] Please also follow instruction sheet, as given.  Follow-Up: At [facility], you and your health needs are our priority.  As part of our continuing mission to provide you with exceptional heart care, we have created designated Provider Care Teams.  These Care Teams include your primary Cardiologist (physician) and Advanced Practice Providers (APPs -  Physician Assistants and Nurse Practitioners) who all work together to provide you with the care you need, when you need it.  We recommend signing up for the patient portal called "MyChart".  Sign up information is provided on this After Visit Summary.  MyChart is used to connect with patients for Virtual Visits (Telemedicine).  Patients are able to view lab/test results, encounter notes, upcoming appointments, etc.  Non-urgent messages can be sent to your provider as well.   To learn more about what you can do with MyChart, go to [email or web address].    Your next appointment:   1 year(s)  The format for your next appointment:   In Person  Provider:   You may see Jodelle Red, MD or one of the following Advanced Practice Providers on your designated Care Team:   Theodore Demark, PA-C Joni Reining, DNP, ANP

## 2020-11-05 ENCOUNTER — Encounter: Payer: Self-pay | Admitting: Cardiology

## 2020-11-05 ENCOUNTER — Telehealth (HOSPITAL_COMMUNITY): Payer: Self-pay | Admitting: *Deleted

## 2020-11-05 NOTE — Telephone Encounter (Signed)
Close encounter 

## 2020-11-10 ENCOUNTER — Ambulatory Visit (HOSPITAL_COMMUNITY)
Admission: RE | Admit: 2020-11-10 | Discharge: 2020-11-10 | Disposition: A | Discharge status: Home / Self Care | Payer: BC Managed Care – PPO | Source: Ambulatory Visit | Attending: Cardiovascular Disease | Admitting: Cardiovascular Disease

## 2020-11-10 ENCOUNTER — Other Ambulatory Visit: Payer: Self-pay

## 2020-11-10 DIAGNOSIS — R079 Chest pain, unspecified: Secondary | ICD-10-CM | POA: Diagnosis present

## 2020-11-10 DIAGNOSIS — Z8249 Family history of ischemic heart disease and other diseases of the circulatory system: Secondary | ICD-10-CM | POA: Diagnosis not present

## 2020-11-10 LAB — EXERCISE TOLERANCE TEST
Estimated workload: 12.6 METS
Exercise duration (min): 10 min
Exercise duration (sec): 33 s
MPHR: 164 {beats}/min
Peak HR: 150 {beats}/min
Percent HR: 91 %
Rest HR: 62 {beats}/min

## 2020-11-11 ENCOUNTER — Telehealth: Payer: Self-pay | Admitting: Cardiology

## 2020-11-11 NOTE — Telephone Encounter (Signed)
Left message for pt to call back  °

## 2020-11-11 NOTE — Telephone Encounter (Signed)
Patient states he needs a note clearing him to go back to work for full duty. He states it needs to be faxed to Jack Quarto at [employer]: [phone]

## 2020-11-11 NOTE — Telephone Encounter (Signed)
Called patient left message on personal voice mail we need to know what kind of work you do before you are cleared to return.Advised to call back.

## 2020-11-12 NOTE — Telephone Encounter (Signed)
    Pt called back, he said he only need a work note stating he is cleared to go back to work. 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 fax [phone] attn: Jack Quarto

## 2020-11-15 NOTE — Telephone Encounter (Signed)
Follow Up:      Pt is calling to check on the status of his note for work. Pt says he needs this asap please.He says  his boss asked about it again today.

## 2020-11-15 NOTE — Telephone Encounter (Signed)
Per Dr. Cristal Deer, pt ok to return to work with any restrictions. Note faxed to Jack Quarto at [phone] as requested.   Pt made aware.

## 2020-12-26 NOTE — Progress Notes (Signed)
Cardiology Office Note   Date:  12/31/2020   ID:  Timothy Herring, DOB [birth year], MRN [id number]  PCP:  Adela Glimpse, NP  Cardiologist:  Dr.Christopher  CC: Follow Up    History of Present Illness: Timothy Herring is a 55 y.o. male who presents for ongoing assessment and management of chest pain, with cardiovascular risk factors to include family history, tobacco abuse.  He was seen posthospitalization where he was admitted for chest pain with elevation in troponin.  At that time the patient was suffering from a critical illness to include acute kidney injury and acute hepatic injury.  It was felt that the troponins were elevated as a result of that.  The patient was scheduled for an exercise treadmill stress test to evaluate for ischemia.   It was noted during hospitalization that he had an echocardiogram which was found to be normal with an EF of 55 to 60%.  The stress test on 11/10/2020 revealed that there were no ST segment deviation noted during stress test or T wave inversion.  Excellent exercise capacity, and revealed a negative adequate stress test.  Comes today without any complaints.  He is back to work.  Remains active.  Only medication he is taking his aspirin daily.  Unfortunately he continues to smoke but is working on stopping.   Past Medical History:  Diagnosis Date   Substance abuse (HCC)    Tobacco abuse     No past surgical history on file.   Current Outpatient Medications  Medication Sig Dispense Refill   aspirin 81 MG chewable tablet Chew 1 tablet (81 mg total) by mouth daily. 90 tablet 1   No current facility-administered medications for this visit.    Allergies:   Patient has no known allergies.    Social History:  The patient  reports that he has been smoking cigarettes. He has never used smokeless tobacco. He reports previous alcohol use. He reports previous drug use.   Family History:  The patient's family history is not on file.    ROS: All  other systems are reviewed and negative. Unless otherwise mentioned in H&P    PHYSICAL EXAM: VS:  BP (!) 142/80   Pulse 75   Ht 5\' 11"  (1.803 m)   Wt 148 lb (67.1 kg)   SpO2 98%   BMI 20.64 kg/m  , BMI Body mass index is 20.64 kg/m. GEN: Well nourished, well developed, in no acute distress HEENT: normal Neck: no JVD, carotid bruits, or masses Cardiac: RRR; no murmurs, rubs, or gallops,no edema  Respiratory:  Clear to auscultation bilaterally, normal work of breathing GI: soft, nontender, nondistended, + BS MS: no deformity or atrophy Skin: warm and dry, no rash Neuro:  Strength and sensation are intact Psych: euthymic mood, full affect   EKG:  EKG is not ordered today.  Recent Labs: 10/23/2020: TSH 0.990 10/26/2020: ALT 155; BUN 16; Creatinine, Ser 1.24; Hemoglobin 11.1; Magnesium 1.9; Platelets 75; Potassium 3.4; Sodium 140    Lipid Panel    Component Value Date/Time   CHOL 122 10/24/2020 1436   TRIG 20 10/24/2020 1436   HDL 42 10/24/2020 1436   CHOLHDL 2.9 10/24/2020 1436   VLDL 4 10/24/2020 1436   LDLCALC 76 10/24/2020 1436      Wt Readings from Last 3 Encounters:  12/31/20 148 lb (67.1 kg)  11/04/20 148 lb 3.2 oz (67.2 kg)  10/24/20 145 lb 15.1 oz (66.2 kg)      Other studies Reviewed: Study  Highlights POET 11/10/2020  Blood pressure demonstrated a normal response to exercise. There was no ST segment deviation noted during stress. No T wave inversion was noted during stress. Excellent exercise capacity. Negative, adequate stress test.   Echocardiogram 10/24/2020 1. Left ventricular ejection fraction, by estimation, is 55 to 60%. The  left ventricle has normal function. The left ventricle has no regional  wall motion abnormalities. There is mild left ventricular hypertrophy.  Left ventricular diastolic parameters  were normal.   2. Right ventricular systolic function is normal. The right ventricular  size is normal. Tricuspid regurgitation signal is  inadequate for assessing  PA pressure.   3. The mitral valve is normal in structure. Trivial mitral valve  regurgitation. No evidence of mitral stenosis.   4. The aortic valve is tricuspid. Aortic valve regurgitation is not  visualized. No aortic stenosis is present.   5. The inferior vena cava is normal in size with greater than 50%  respiratory variability, suggesting right atrial pressure of 3 mmHg.   ASSESSMENT AND PLAN:  1.  Chest pain: Has not had any recurrence of symptoms since hospitalization.  Echocardiogram and stress test were negative for ischemia or reduction in LV function.  No further cardiac testing is planned unless symptoms return.  We will see him as needed  2.  Acute kidney injury: Being followed by PCP.  He is not on any medications which are nephrotoxic.  Continue hydration.  3.  Tobacco abuse: He continues to smoke but states he is cutting down.  I have encouraged him to please do so for his overall health and reduction of cardiovascular risk factor.  He verbalizes understanding.  4. Pre-hypertension: I have asked him to keep tract of his BP. He may need antihypertensive treatment if remains elevated.    Current medicines are reviewed at length with the patient today.  I have spent 20 mins  dedicated to the care of this patient on the date of this encounter to include pre-visit review of records, assessment, management and diagnostic testing,with shared decision making.  Labs/ tests ordered today include: None  Bettey Mare. Liborio Nixon, ANP, AACC   12/31/2020 11:11 AM    [facility] [address] Office [phone] Fax [phone]  Notice: This dictation was prepared with Dragon dictation along with smaller phrase technology. Any transcriptional errors that result from this process are unintentional and may not be corrected upon review.

## 2020-12-31 ENCOUNTER — Encounter: Payer: Self-pay | Admitting: Adult Health

## 2020-12-31 ENCOUNTER — Ambulatory Visit (INDEPENDENT_AMBULATORY_CARE_PROVIDER_SITE_OTHER): Payer: BC Managed Care – PPO | Admitting: Adult Health

## 2020-12-31 ENCOUNTER — Other Ambulatory Visit: Payer: Self-pay

## 2020-12-31 VITALS — BP 142/80 | HR 75 | Ht 71.0 in | Wt 148.0 lb

## 2020-12-31 DIAGNOSIS — I10 Essential (primary) hypertension: Secondary | ICD-10-CM | POA: Diagnosis not present

## 2020-12-31 DIAGNOSIS — Z716 Tobacco abuse counseling: Secondary | ICD-10-CM

## 2020-12-31 DIAGNOSIS — R079 Chest pain, unspecified: Secondary | ICD-10-CM

## 2020-12-31 NOTE — Patient Instructions (Signed)
Medication Instructions:  No changes *If you need a refill on your cardiac medications before your next appointment, please call your pharmacy*   Lab Work: No labs If you have labs (blood work) drawn today and your tests are completely normal, you will receive your results only by: MyChart Message (if you have MyChart) OR A paper copy in the mail If you have any lab test that is abnormal or we need to change your treatment, we will call you to review the results.   Testing/Procedures: No Testing    Follow-Up: At [facility], you and your health needs are our priority.  As part of our continuing mission to provide you with exceptional heart care, we have created designated Provider Care Teams.  These Care Teams include your primary Cardiologist (physician) and Advanced Practice Providers (APPs -  Physician Assistants and Nurse Practitioners) who all work together to provide you with the care you need, when you need it.  We recommend signing up for the patient portal called "MyChart".  Sign up information is provided on this After Visit Summary.  MyChart is used to connect with patients for Virtual Visits (Telemedicine).  Patients are able to view lab/test results, encounter notes, upcoming appointments, etc.  Non-urgent messages can be sent to your provider as well.   To learn more about what you can do with MyChart, go to [email or web address].    Your next appointment:   As Needed  The format for your next appointment:   In Person  Provider:   Jodelle Red, MD   Return to Work ___________________________________________________ was treated at our facility. Injury or illness was: ___Work-related. ___Not work-related. ___Undetermined if work-related. Return to work Employee may return to work on ______________________. Employee may return to modified work on ______________________. Work activity restrictions This person is not able to do the following  activities: ___Bend ___Sit for a prolonged time This person should not sit for more than ____ hours at a time. This person should not sit for more than ____ hours during an 8-hour workday. ___Lift more than ________ lb ___Squat ___ Stand for a prolonged time ___ This person should not stand for more than ____ hours at a time. ___ This person should not stand for more than ____ hours during an 8-hour workday. ___Climb ___Reach ___Push and pull with the ___ right hand ___ left hand ___ Walk ___ This person should not walk for more than ____ hours at a time. ___ This person should not walk for more than ____ hours during an 8-hour workday. ___ Drive or operate a motor vehicle at work ___ [employer] with the ___ right hand ___ left hand ___Other _________________________________________________________________ These restrictions are effective until ______________________ or until arecheck appointment on ______________________. Health care provider name (printed): _________________________________________ Health care provider (signature): _________________________________________ Date: _________________________________________ How to use this form Show this Return to Work statement to your supervisor at work as soon as possible. Your employer should be aware of your condition and may be able to help with the necessary workactivity restrictions. Contact your health care provider if: You wish to return to work sooner than the date that is listed above. You have problems that make it difficult for you to return at that time. This information is not intended to replace advice given to you by your health care provider. Make sure you discuss any questions you have with your healthcare provider. Document Revised: 05/05/2020 Document Reviewed: 05/05/2020 Elsevier Patient Education  2022 [employer].

## 2021-07-25 IMAGING — CT CT CERVICAL SPINE W/O CM
3 of 4 series · 13 of 33 positions shown, 16 images · non-contrast
Comparison: None.

CLINICAL DATA: Recent fall with neck pain, initial encounter

EXAM:
CT CERVICAL SPINE WITHOUT CONTRAST
TECHNIQUE: Multidetector CT imaging of the cervical spine was performed without
intravenous contrast. Multiplanar CT image reconstructions were also
generated.

[Series 4: c_spine 2.0 st · axial · 0.34mm/px · z∈[-274,-128]mm · 5 of 103 slices shown, 7 images]
[im 15/103  soft-tissue]
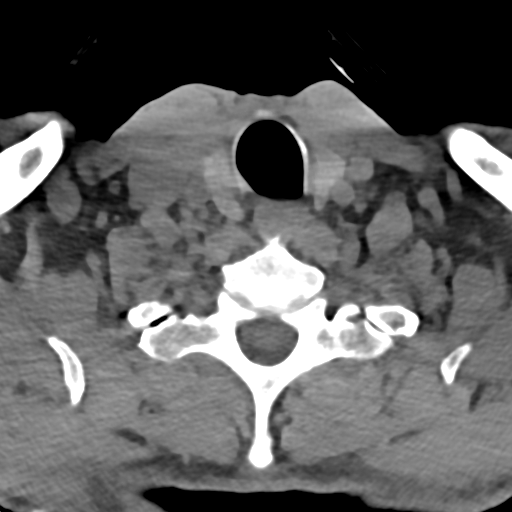
[im 15/103  bone]
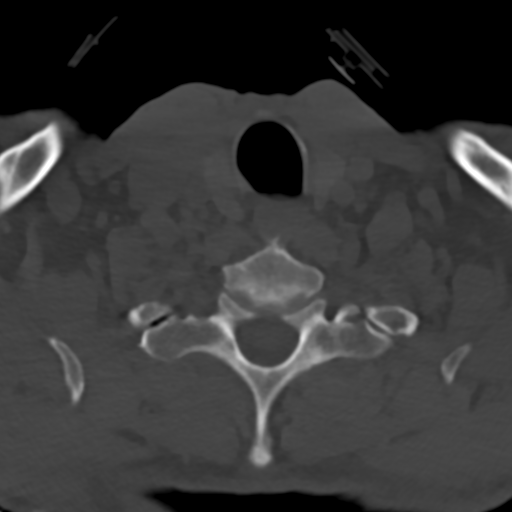
[im 30/103  bone]
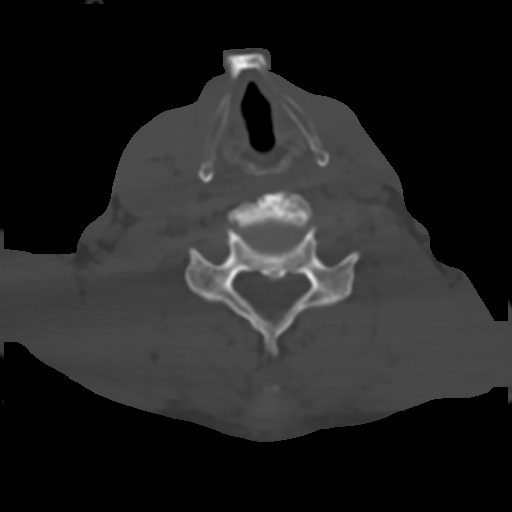
[im 59/103  bone]
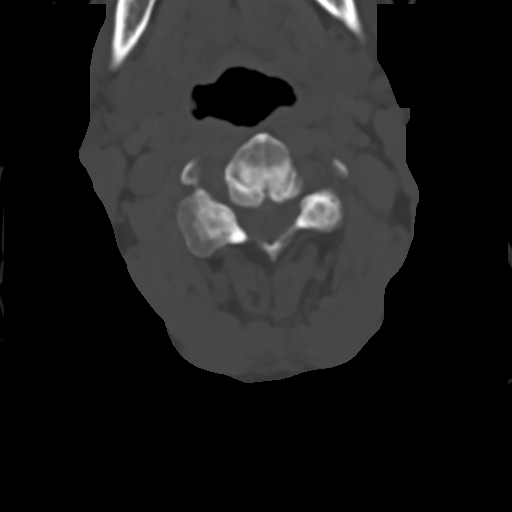
[im 73/103  bone]
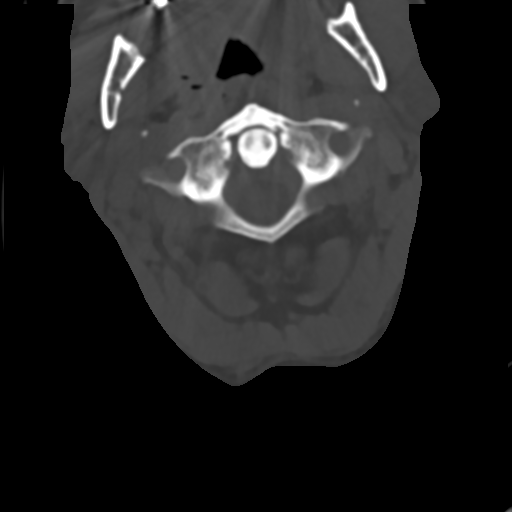
[im 88/103  soft-tissue]
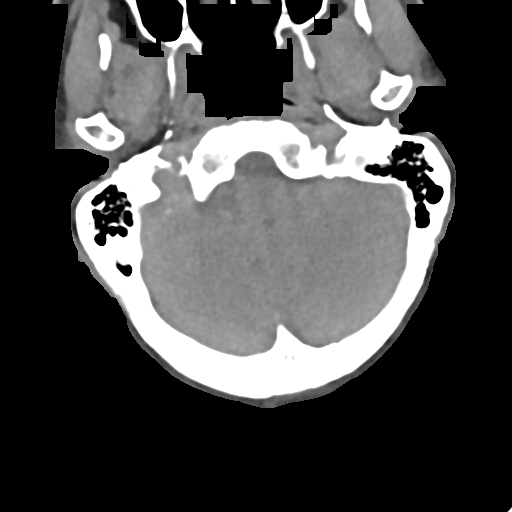
[im 88/103  bone]
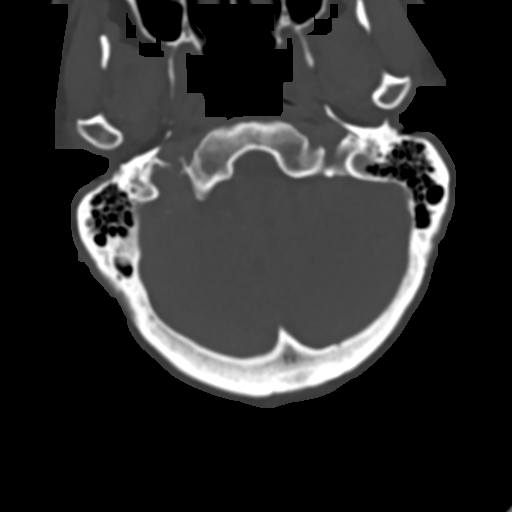

[Series 6: c_spine 2.0 sag bone · sagittal · 0.30mm/px · 5 of 61 slices shown, 6 images]
[im 21/61  bone]
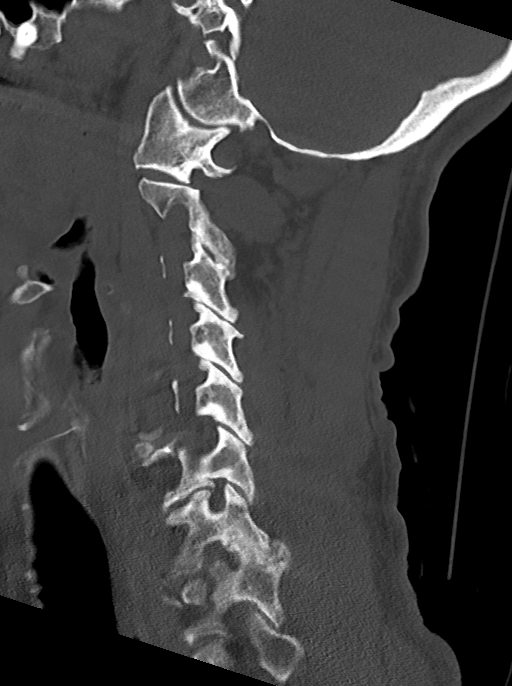
[im 26/61  bone]
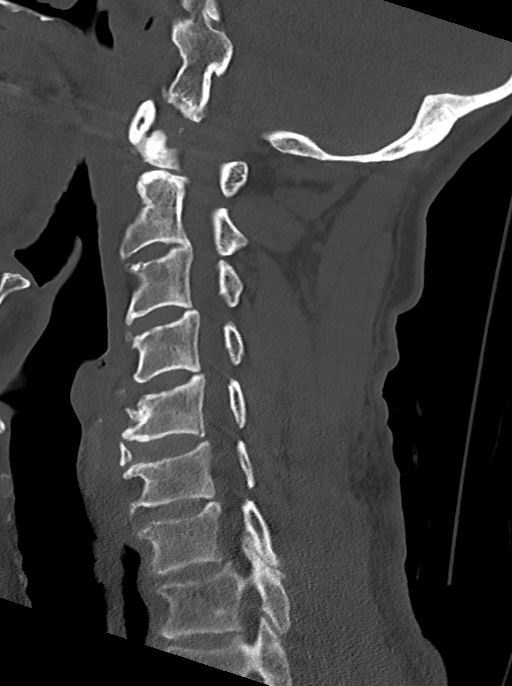
[im 31/61  soft-tissue]
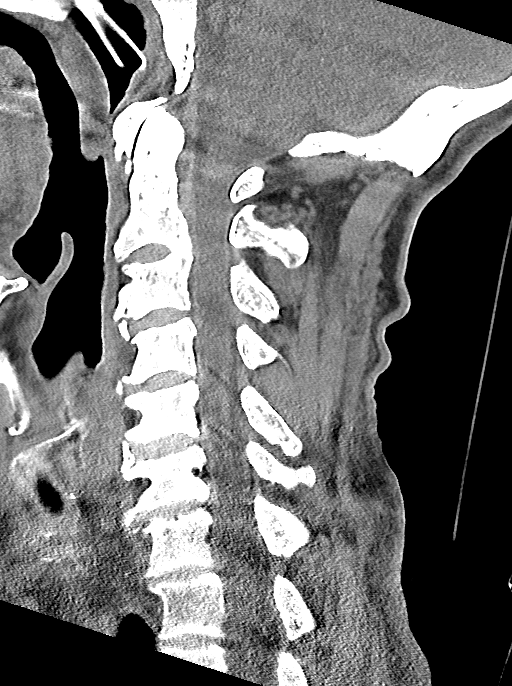
[im 31/61  bone]
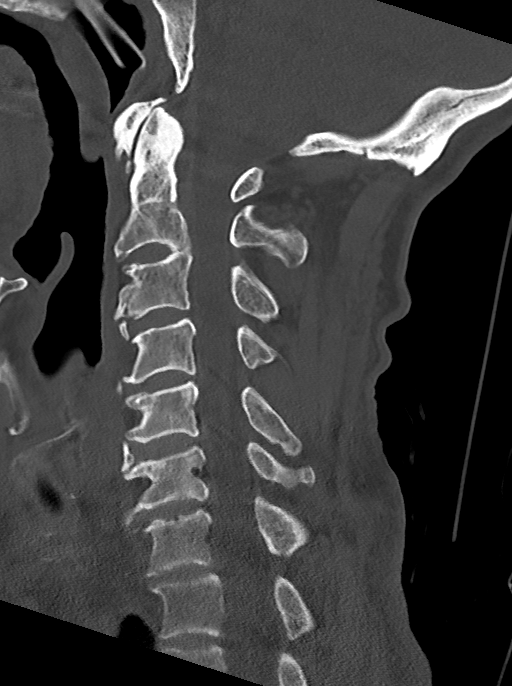
[im 36/61  bone]
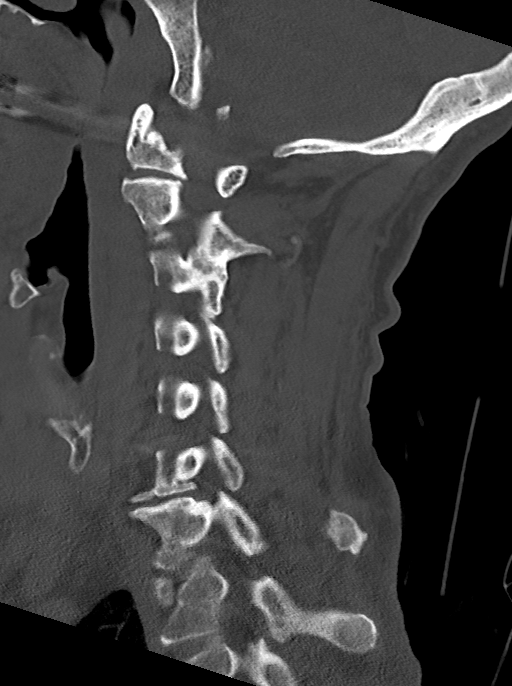
[im 41/61  bone]
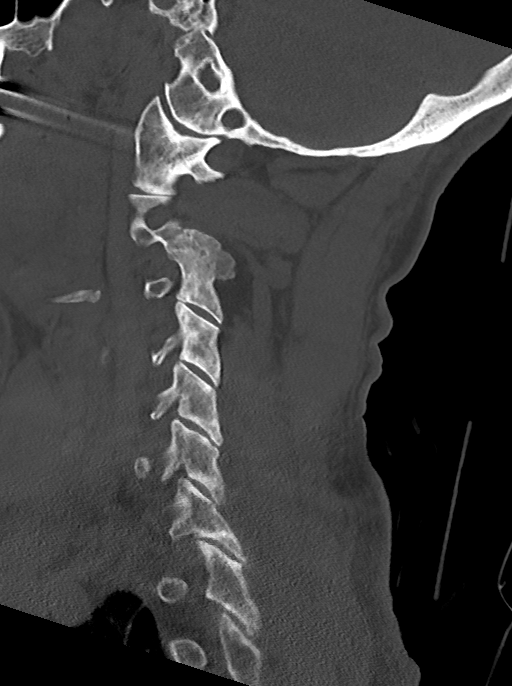

[Series 7: c_spine 2.0 cor bone · coronal · 0.30mm/px · 3 of 60 slices shown]
[im 12/60  bone]
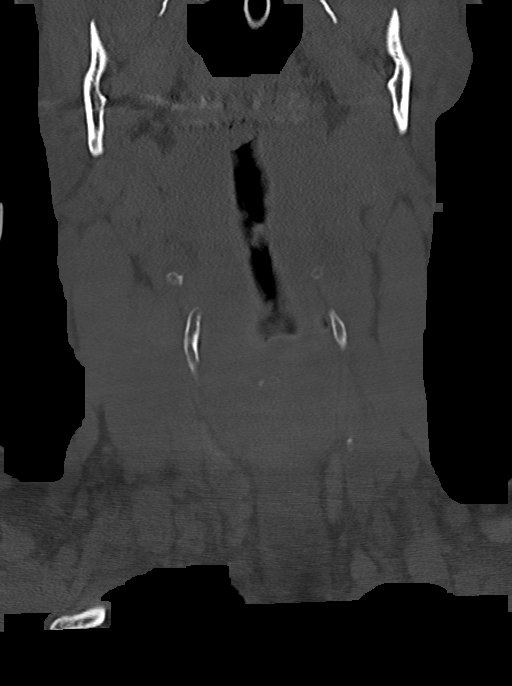
[im 24/60  bone]
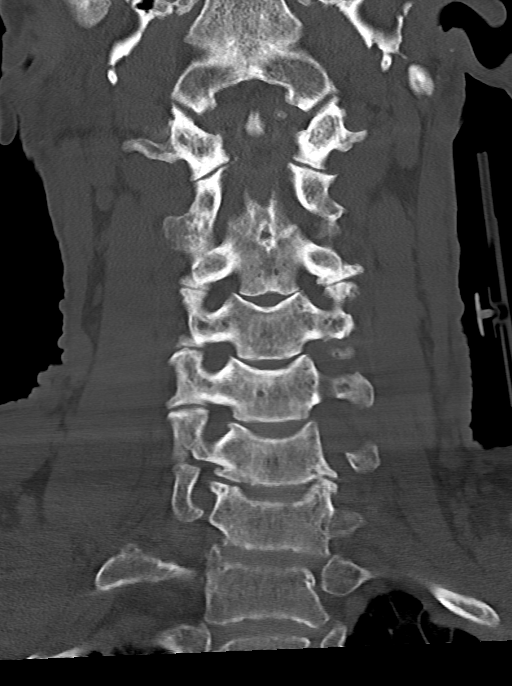
[im 36/60  bone]
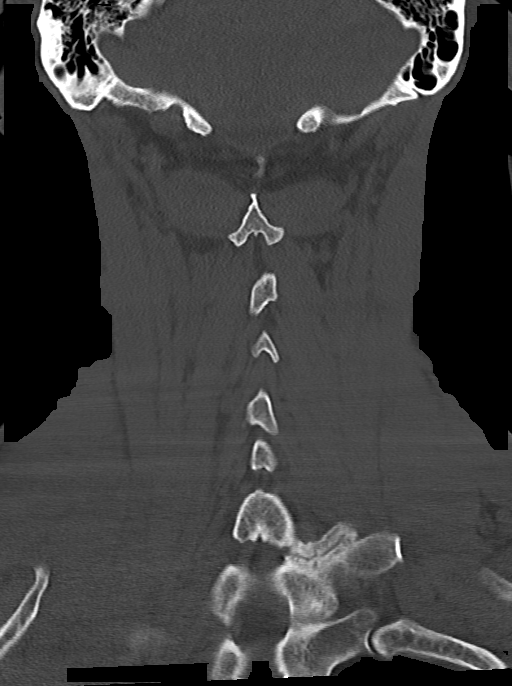

[13 of 33 positions shown; findings below may reference images not displayed]

FINDINGS: Alignment: Within normal limits.

Skull base and vertebrae: 7 cervical segments are well visualized.
Vertebral body height is well maintained. No acute fracture or acute
facet abnormality is noted. Multilevel facet hypertrophic changes
are seen as well as multilevel osteophytic changes.

Soft tissues and spinal canal: Surrounding soft tissue structures
are within normal limits.

Upper chest: Visualized lung apices demonstrate some interstitial
thickening of uncertain significance. This may represent some mild
edema.

Other: None
IMPRESSION: Degenerative changes of the cervical spine without acute bony
abnormality.

Mild interstitial thickening in the apices which may represent some
mild pulmonary edema.

## 2022-03-20 ENCOUNTER — Encounter (HOSPITAL_BASED_OUTPATIENT_CLINIC_OR_DEPARTMENT_OTHER): Payer: Self-pay | Admitting: Cardiology
